# Patient Record
Sex: Female | Born: 1990 | Race: Black or African American | Hispanic: No | Marital: Single | Smoking: Current every day smoker
Health system: Southern US, Community
[De-identification: ages and names within clinical notes are randomized; demographics above are authoritative.]

## PROBLEM LIST (undated history)

## (undated) ENCOUNTER — Inpatient Hospital Stay (HOSPITAL_COMMUNITY): Payer: Self-pay

## (undated) DIAGNOSIS — Z789 Other specified health status: Secondary | ICD-10-CM

## (undated) HISTORY — PX: NO PAST SURGERIES: SHX2092

---
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
## 2009-01-03 ENCOUNTER — Emergency Department (HOSPITAL_COMMUNITY): Admission: EM | Admit: 2009-01-03 | Discharge: 2009-01-04 | Payer: Self-pay | Admitting: Emergency Medicine

## 2009-05-13 ENCOUNTER — Emergency Department (HOSPITAL_COMMUNITY): Admission: EM | Admit: 2009-05-13 | Discharge: 2009-05-13 | Payer: Self-pay | Admitting: Emergency Medicine

## 2009-05-24 ENCOUNTER — Emergency Department (HOSPITAL_COMMUNITY): Admission: EM | Admit: 2009-05-24 | Discharge: 2009-05-24 | Payer: Self-pay | Admitting: Emergency Medicine

## 2009-05-26 ENCOUNTER — Inpatient Hospital Stay (HOSPITAL_COMMUNITY): Admission: AD | Admit: 2009-05-26 | Discharge: 2009-05-26 | Payer: Self-pay | Admitting: Obstetrics & Gynecology

## 2009-10-03 ENCOUNTER — Inpatient Hospital Stay (HOSPITAL_COMMUNITY): Admission: AD | Admit: 2009-10-03 | Discharge: 2009-10-03 | Payer: Self-pay | Admitting: Obstetrics and Gynecology

## 2009-10-03 ENCOUNTER — Ambulatory Visit: Payer: Self-pay | Admitting: Physician Assistant

## 2009-11-15 ENCOUNTER — Inpatient Hospital Stay (HOSPITAL_COMMUNITY): Admission: AD | Admit: 2009-11-15 | Discharge: 2009-11-15 | Payer: Self-pay | Admitting: Obstetrics & Gynecology

## 2009-12-29 ENCOUNTER — Inpatient Hospital Stay (HOSPITAL_COMMUNITY): Admission: AD | Admit: 2009-12-29 | Discharge: 2009-12-29 | Payer: Self-pay | Admitting: Obstetrics & Gynecology

## 2009-12-29 ENCOUNTER — Ambulatory Visit: Payer: Self-pay | Admitting: Advanced Practice Midwife

## 2010-02-24 ENCOUNTER — Ambulatory Visit (HOSPITAL_COMMUNITY): Admission: RE | Admit: 2010-02-24 | Discharge: 2010-02-24 | Payer: Self-pay | Admitting: Family Medicine

## 2010-03-07 ENCOUNTER — Ambulatory Visit: Payer: Self-pay | Admitting: Family Medicine

## 2010-03-09 ENCOUNTER — Ambulatory Visit: Payer: Self-pay | Admitting: Family Medicine

## 2010-03-10 ENCOUNTER — Encounter: Payer: Self-pay | Admitting: Family Medicine

## 2010-03-10 LAB — CONVERTED CEMR LAB: GC Probe Amp, Genital: NEGATIVE

## 2010-03-13 ENCOUNTER — Ambulatory Visit: Payer: Self-pay | Admitting: Obstetrics & Gynecology

## 2010-03-13 LAB — CONVERTED CEMR LAB
Clue Cells Wet Prep HPF POC: NONE SEEN
Trich, Wet Prep: NONE SEEN

## 2010-03-16 ENCOUNTER — Encounter (INDEPENDENT_AMBULATORY_CARE_PROVIDER_SITE_OTHER): Payer: Self-pay | Admitting: Family Medicine

## 2010-03-16 ENCOUNTER — Encounter: Admission: RE | Admit: 2010-03-16 | Discharge: 2010-03-16 | Payer: Self-pay | Admitting: Obstetrics & Gynecology

## 2010-03-16 ENCOUNTER — Ambulatory Visit: Payer: Self-pay | Admitting: Obstetrics & Gynecology

## 2010-03-20 ENCOUNTER — Ambulatory Visit: Payer: Self-pay | Admitting: Obstetrics & Gynecology

## 2010-03-20 ENCOUNTER — Ambulatory Visit (HOSPITAL_COMMUNITY): Admission: RE | Admit: 2010-03-20 | Discharge: 2010-03-20 | Payer: Self-pay | Admitting: Family Medicine

## 2010-03-23 ENCOUNTER — Ambulatory Visit: Payer: Self-pay | Admitting: Obstetrics & Gynecology

## 2010-03-27 ENCOUNTER — Ambulatory Visit: Payer: Self-pay | Admitting: Obstetrics & Gynecology

## 2010-03-27 ENCOUNTER — Inpatient Hospital Stay (HOSPITAL_COMMUNITY): Admission: RE | Admit: 2010-03-27 | Discharge: 2010-03-30 | Payer: Self-pay | Admitting: Obstetrics & Gynecology

## 2010-09-07 LAB — POCT URINALYSIS DIPSTICK
Bilirubin Urine: NEGATIVE
Bilirubin Urine: NEGATIVE
Bilirubin Urine: NEGATIVE
Glucose, UA: NEGATIVE mg/dL
Glucose, UA: NEGATIVE mg/dL
Glucose, UA: NEGATIVE mg/dL
Hgb urine dipstick: NEGATIVE
Ketones, ur: NEGATIVE mg/dL
Ketones, ur: NEGATIVE mg/dL
Ketones, ur: NEGATIVE mg/dL
Nitrite: NEGATIVE
Specific Gravity, Urine: 1.02 (ref 1.005–1.030)
Specific Gravity, Urine: >=1.03 (ref 1.005–1.030)
pH: 7 (ref 5.0–8.0)

## 2010-09-07 LAB — CBC
HCT: 20.8 % — ABNORMAL LOW (ref 36.0–46.0)
Hemoglobin: 9.9 g/dL — ABNORMAL LOW (ref 12.0–15.0)
MCHC: 33.2 g/dL (ref 30.0–36.0)
MCHC: 34.3 g/dL (ref 30.0–36.0)
Platelets: 177 10*3/uL (ref 150–400)
Platelets: 228 10*3/uL (ref 150–400)
RDW: 14.8 % (ref 11.5–15.5)
WBC: 14.4 10*3/uL — ABNORMAL HIGH (ref 4.0–10.5)

## 2010-09-07 LAB — RPR: RPR Ser Ql: NONREACTIVE

## 2010-09-10 LAB — URINALYSIS, ROUTINE W REFLEX MICROSCOPIC
Glucose, UA: NEGATIVE mg/dL
Hgb urine dipstick: NEGATIVE
Protein, ur: NEGATIVE mg/dL
Specific Gravity, Urine: 1.015 (ref 1.005–1.030)
Urobilinogen, UA: 0.2 mg/dL (ref 0.0–1.0)

## 2010-09-10 LAB — URINE MICROSCOPIC-ADD ON

## 2010-09-11 LAB — GC/CHLAMYDIA PROBE AMP, GENITAL
Chlamydia, DNA Probe: NEGATIVE
GC Probe Amp, Genital: NEGATIVE

## 2010-09-11 LAB — WET PREP, GENITAL: Yeast Wet Prep HPF POC: NONE SEEN

## 2010-09-11 LAB — URINE CULTURE: Colony Count: >=100000

## 2010-09-11 LAB — URINALYSIS, ROUTINE W REFLEX MICROSCOPIC
Bilirubin Urine: NEGATIVE
Glucose, UA: NEGATIVE mg/dL
Ketones, ur: NEGATIVE mg/dL
Protein, ur: NEGATIVE mg/dL
pH: 6.5 (ref 5.0–8.0)

## 2010-09-11 LAB — URINE MICROSCOPIC-ADD ON

## 2010-09-13 LAB — URINALYSIS, ROUTINE W REFLEX MICROSCOPIC
Glucose, UA: NEGATIVE mg/dL
Protein, ur: NEGATIVE mg/dL
Specific Gravity, Urine: >1.03 — ABNORMAL HIGH (ref 1.005–1.030)
pH: 6 (ref 5.0–8.0)

## 2010-09-13 LAB — GC/CHLAMYDIA PROBE AMP, GENITAL
Chlamydia, DNA Probe: NEGATIVE
GC Probe Amp, Genital: NEGATIVE

## 2010-09-13 LAB — POCT PREGNANCY, URINE: Preg Test, Ur: POSITIVE

## 2010-09-26 LAB — CBC
HCT: 37.6 % (ref 36.0–46.0)
MCHC: 32.6 g/dL (ref 30.0–36.0)
MCV: 85.9 fL (ref 78.0–100.0)
Platelets: 256 10*3/uL (ref 150–400)
WBC: 8.8 10*3/uL (ref 4.0–10.5)

## 2010-09-26 LAB — URINALYSIS, ROUTINE W REFLEX MICROSCOPIC
Bilirubin Urine: NEGATIVE
Ketones, ur: NEGATIVE mg/dL
Nitrite: NEGATIVE
pH: 7 (ref 5.0–8.0)

## 2010-09-26 LAB — GC/CHLAMYDIA PROBE AMP, GENITAL
Chlamydia, DNA Probe: NEGATIVE
GC Probe Amp, Genital: NEGATIVE

## 2010-09-26 LAB — URINE MICROSCOPIC-ADD ON

## 2010-09-26 LAB — POCT PREGNANCY, URINE: Preg Test, Ur: NEGATIVE

## 2010-09-26 LAB — WET PREP, GENITAL: Yeast Wet Prep HPF POC: NONE SEEN

## 2010-09-27 LAB — URINALYSIS, ROUTINE W REFLEX MICROSCOPIC
Ketones, ur: NEGATIVE mg/dL
Nitrite: NEGATIVE
Protein, ur: NEGATIVE mg/dL
pH: 6 (ref 5.0–8.0)

## 2010-09-27 LAB — GC/CHLAMYDIA PROBE AMP, GENITAL: Chlamydia, DNA Probe: NEGATIVE

## 2010-10-01 LAB — URINALYSIS, ROUTINE W REFLEX MICROSCOPIC
Nitrite: NEGATIVE
Specific Gravity, Urine: 1.018 (ref 1.005–1.030)
pH: 7.5 (ref 5.0–8.0)

## 2010-10-01 LAB — URINE MICROSCOPIC-ADD ON

## 2011-03-21 LAB — URINALYSIS, ROUTINE W REFLEX MICROSCOPIC
Bilirubin Urine: NEGATIVE
Glucose, UA: NEGATIVE
Ketones, ur: NEGATIVE
pH: 6

## 2011-03-21 LAB — CBC
Hemoglobin: 11.5 — ABNORMAL LOW
MCHC: 33.9
RBC: 3.94
WBC: 7.9

## 2011-03-21 LAB — WET PREP, GENITAL: Yeast Wet Prep HPF POC: NONE SEEN

## 2011-03-21 LAB — GC/CHLAMYDIA PROBE AMP, GENITAL
Chlamydia, DNA Probe: NEGATIVE
GC Probe Amp, Genital: NEGATIVE

## 2011-03-21 LAB — PROTIME-INR: INR: 0.9

## 2011-03-22 LAB — CULTURE, ROUTINE-ABSCESS

## 2011-03-23 LAB — POCT URINALYSIS DIP (DEVICE)
Bilirubin Urine: NEGATIVE
Glucose, UA: NEGATIVE
Hgb urine dipstick: NEGATIVE
Nitrite: NEGATIVE
Operator id: 148111
Urobilinogen, UA: 0.2

## 2011-03-26 LAB — POCT URINALYSIS DIP (DEVICE)
Bilirubin Urine: NEGATIVE
Bilirubin Urine: NEGATIVE
Bilirubin Urine: NEGATIVE
Glucose, UA: NEGATIVE
Glucose, UA: NEGATIVE
Glucose, UA: NEGATIVE
Glucose, UA: NEGATIVE
Hgb urine dipstick: NEGATIVE
Hgb urine dipstick: NEGATIVE
Nitrite: NEGATIVE
Nitrite: NEGATIVE
Nitrite: NEGATIVE
Nitrite: NEGATIVE
Nitrite: POSITIVE — AB
Operator id: 194561
Operator id: 200901
Protein, ur: 30 — AB
Urobilinogen, UA: 0.2
Urobilinogen, UA: 1
Urobilinogen, UA: 1
Urobilinogen, UA: 1
Urobilinogen, UA: 1
pH: 7
pH: 7
pH: 7

## 2011-03-27 LAB — POCT URINALYSIS DIP (DEVICE)
Nitrite: POSITIVE — AB
Protein, ur: 30 — AB
pH: 7

## 2011-03-27 LAB — CBC
MCHC: 32.4
Platelets: 260
RDW: 14.3

## 2011-03-28 LAB — POCT URINALYSIS DIP (DEVICE)
Glucose, UA: NEGATIVE
Hgb urine dipstick: NEGATIVE
Nitrite: NEGATIVE
Operator id: 120861
Protein, ur: NEGATIVE
Urobilinogen, UA: 1

## 2011-03-30 LAB — URINE CULTURE: Colony Count: >=100000

## 2011-03-30 LAB — URINE MICROSCOPIC-ADD ON

## 2011-03-30 LAB — URINALYSIS, ROUTINE W REFLEX MICROSCOPIC
Bilirubin Urine: NEGATIVE
Glucose, UA: NEGATIVE mg/dL
Hgb urine dipstick: NEGATIVE
Ketones, ur: NEGATIVE mg/dL
Nitrite: NEGATIVE
Protein, ur: NEGATIVE mg/dL
Specific Gravity, Urine: 1.028 (ref 1.005–1.030)
Urobilinogen, UA: 1 mg/dL (ref 0.0–1.0)
pH: 6.5 (ref 5.0–8.0)

## 2011-05-23 ENCOUNTER — Emergency Department (HOSPITAL_COMMUNITY)
Admission: EM | Admit: 2011-05-23 | Discharge: 2011-05-24 | Disposition: A | Discharge status: Home / Self Care | Payer: Self-pay | Attending: Emergency Medicine | Admitting: Emergency Medicine

## 2011-05-23 ENCOUNTER — Encounter: Payer: Self-pay | Admitting: *Deleted

## 2011-05-23 DIAGNOSIS — N644 Mastodynia: Secondary | ICD-10-CM | POA: Insufficient documentation

## 2011-05-23 DIAGNOSIS — N6009 Solitary cyst of unspecified breast: Secondary | ICD-10-CM | POA: Insufficient documentation

## 2011-05-23 NOTE — ED Notes (Signed)
Pt reports having a left breat lump that developed "sometime ago" Pt cannot state when pain started. Pt states that [facility] in [place] but is unable to state when she went to the center. Pt reports that she was supposed to follow up with them but never did. Pt is a very poor historian. 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 Fiancee present. Pt has a small hard, movable mass near the areola of left breat that is moveable. Pt did not flinch when mass was touched.

## 2011-05-24 MED ORDER — IBUPROFEN 800 MG PO TABS: 800.0000 mg | ORAL_TABLET | Freq: Three times a day (TID) | ORAL | Status: AC

## 2011-05-24 NOTE — ED Provider Notes (Signed)
History     CSN: [id number] Arrival date & time: 05/23/2011  9:43 PM   First MD Initiated Contact with Patient 05/24/11 0258      Chief Complaint  Patient presents with  . Breast Pain   (Consider location/radiation/quality/duration/timing/severity/associated sxs/prior treatment) The history is provided by the patient.  Left breast pain that is intermittent. The patient states "I don't know how long it has been a problem". She cannot give a clear history of recurrence, whether daily or weekly. She has noticed a lump. The pain is always located in the same place and does not affect other areas. No nipple discharge, redness or swelling. She reports a family history of breast cancers.    History reviewed. No pertinent past medical history.  History reviewed. No pertinent past surgical history.  Family History  Problem Relation Age of Onset  . Asthma Mother     History  Substance Use Topics  . Smoking status: Former Smoker -- 1.0 packs/day for 2 years    Types: Cigarettes    Quit date: 05/22/2008  . Smokeless tobacco: Never Used  . Alcohol Use: No    OB History    Grav Para Term Preterm Abortions TAB SAB Ect Mult Living   2 2 2              Review of Systems  Constitutional: Negative for fever and chills.  HENT: Negative.   Respiratory: Negative.   Cardiovascular: Negative.   Gastrointestinal: Negative.   Genitourinary:       C/o left breast pain, intermittent.  Musculoskeletal: Negative.   Skin: Negative.   Neurological: Negative.     Allergies  Review of patient's allergies indicates no known allergies.  Home Medications  No current outpatient prescriptions on file.  BP 114/70  Pulse 69  Temp(Src) 98.9 F (37.2 C) (Oral)  Resp 16  SpO2 100%  Physical Exam  Constitutional: She is oriented to person, place, and time. She appears well-developed and well-nourished.  Neck: Normal range of motion.  Pulmonary/Chest: Effort normal.       Left breast  without swelling. There is a palpable, tender, firm cyst in the upper outer quadrant, adjacent to the aereola. No nipple discharge. No axillary lymph nodes palpable.   Neurological: She is alert and oriented to person, place, and time.  Skin: Skin is warm and dry.    ED Course  Procedures (including critical care time)  Labs Reviewed - No data to display No results found.   No diagnosis found.    MDM          Rodena Medin, PA 05/24/11 [phone]

## 2011-05-24 NOTE — ED Provider Notes (Signed)
Medical screening examination/treatment/procedure(s) were performed by non-physician practitioner and as supervising physician I was immediately available for consultation/collaboration.  Olivia Mackie, MD 05/24/11 [phone]

## 2011-05-24 NOTE — ED Notes (Signed)
Pt alert, nad, c/o ? "lump" to inner aspect of left breast, onset unknown, pt states tender to touch, denies recent illness or exposures

## 2011-06-27 ENCOUNTER — Encounter (INDEPENDENT_AMBULATORY_CARE_PROVIDER_SITE_OTHER): Payer: Self-pay | Admitting: General Surgery

## 2011-07-02 ENCOUNTER — Encounter (INDEPENDENT_AMBULATORY_CARE_PROVIDER_SITE_OTHER): Payer: Self-pay | Admitting: General Surgery

## 2011-07-02 ENCOUNTER — Ambulatory Visit (INDEPENDENT_AMBULATORY_CARE_PROVIDER_SITE_OTHER): Payer: Self-pay | Admitting: General Surgery

## 2011-07-02 VITALS — BP 114/58 | HR 75 | Temp 98.2°F | Ht 65.5 in | Wt 136.8 lb

## 2011-07-02 DIAGNOSIS — D249 Benign neoplasm of unspecified breast: Secondary | ICD-10-CM | POA: Insufficient documentation

## 2011-07-02 NOTE — Patient Instructions (Signed)
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 You will be scheduled for an operation to remove this lump in the near future.  Breast Biopsy WHY YOU NEED A BIOPSY Your caregiver has recommended that you have a breast tissue sample taken (biopsy). This is done to be certain that the lump or abnormality found in your breast is not cancerous (malignant). During a biopsy, a small piece of tissue is removed, so it can be examined under a microscope by a specialist (pathologist) who looks at tissues and cells and diagnoses abnormalities in them. Most lumps (tumors) or abnormalities, on or in the breast, are not cancerous (benign). However, biopsies are taken when your caregiver cannot be absolutely certain of what is wrong only from doing a physical exam, mammogram (breast X-ray), or other studies. A breast biopsy can tell you whether nothing more needs to be done, or you need more surgery or another type of treatment. A biopsy is done when there is:  Any undiagnosed breast mass.   Nipple abnormalities, dimpling, crusting, or ulcerations.   Calcium deposits (calcifications) or abnormalities seen on your mammogram, ultrasound, or MRI.   Suspicious changes in the breast (thickening, asymmetry) seen on mammogram.   Abnormal discharge from the nipple, especially blood.   Redness, swelling, and pain of the breast.  HOW A BIOPSY IS PERFORMED A biopsy is often performed on an outpatient basis (you go home the same day). This can be done in a hospital, clinic, or surgical center. Tissue samples (biopsies) are often done under local anesthesia (area is numbed). Sometimes general anesthetics are required, in which case you sleep through the procedure. Biopsies may remove the entire lump, a small piece of the lump, or a small sliver of tissue removed by needle. TYPES OF BREAST BIOPSY  Fine needle aspiration. A thin needle is placed through the skin, to the lump or cyst,  and cells are removed.   Core needle biopsy. A large needle with a special tip is placed through the skin, to the abnormality, and a piece of tissue is removed.   Stereotactic biopsy. A core needle with a special X-ray is used, to direct the needle to the lump or abnormal area, which is difficult to feel or cannot be felt.   Vacuum-assisted biopsy. A hollow probe and a gentle vacuum remove a sample of tissue.   Ultrasound guided core needle biopsy. You lie on your stomach, with your breast through an opening, and a high frequency ultrasound helps guide the needle to the area of the abnormality.   Open biopsy. An incision is made in the breast, and a piece of the lump or the whole lump is removed.  LET YOUR CAREGIVER KNOW ABOUT:  Allergies.   Medicines taken, including herbs, eye drops, over-the-counter medicines, and creams.   Use of steroids (by mouth or creams).   Previous problems with anesthetics or Novocaine.   If you are taking aspirin or blood thinners.   Possibility of pregnancy, if this applies.   History of blood clots (thrombophlebitis).   History of bleeding or blood problems.   Previous surgery.   Other health problems.  RISKS AND COMPLICATIONS   Bleeding.   Infection.   Allergy to medicines.   Bruising and swelling of the breast.   Alteration in the shape of the breast.   Not finding the lump or abnormality.   Needing more surgery.  BEFORE THE PROCEDURE  You should arrive 96  minutes prior to your procedure or as directed.   Check-in at the admissions desk, to fill out necessary forms, if you are not preregistered.   There will be consent forms to sign, prior to the procedure.   There is a waiting area for your family, while you are having your biopsy.   Try to have someone with you, to drive you home.   Do not smoke for 2 weeks before the surgery.   Let your caregiver know if you develop a cold or an infection.   Do not drink alcohol for at  least 24 hours before surgery.   Wear a good support bra to the surgery.  AFTER THE PROCEDURE  After surgery, you will be taken to the recovery area, where a nurse will watch and check your progress. Once you are awake, stable, and taking fluids well, if there are no other problems, you will be allowed to go home.   Ice packs applied to your operative site may help with discomfort and keep the swelling down.   You may resume normal diet and activities as directed. Avoid strenuous activities affecting the arm on the side of the biopsy, such as tennis, swimming, heavy lifting (more than 10 pounds) or pulling.   Bruising in the breast is normal following this procedure.   Wearing a support bra, even to bed, may be more comfortable. The bra will also help keep the dressing on.   Change dressings as directed.   Your doctor may apply a pressure dressing on your breast for 24 to 48 hours.   Only take over-the-counter or prescription medicines for pain, discomfort, or fever as directed by your caregiver.   Do not take aspirin, because it can cause bleeding.  HOME CARE INSTRUCTIONS   You may resume your usual diet.   Have someone drive you home after the surgery.   Do not do any exercise, driving, lifting or general activities without your caregiver's permission.   Take medicines and over-the-counter medicines, as ordered by your caregiver.   Keep your postoperative appointments as recommended.   Do not drink alcohol while taking pain medicine.  Finding out the results of your test Not all test results are available during your visit. If your test results are not back during the visit, make an appointment with your caregiver to find out the results. Do not assume everything is normal if you have not heard from your caregiver or the medical facility. It is important for you to follow up on all of your test results.  SEEK MEDICAL CARE IF:   You notice redness, swelling, or increasing pain  in the wound.   You notice a bad smell coming from the wound or dressing.   You develop a rash.   You need stronger pain medicine.   You are having an allergic reaction or problems with your medicines.  SEEK IMMEDIATE MEDICAL CARE IF:   You have difficulty breathing.   You have a fever.   There is increased bleeding (more than a small spot) from the wound.   Pus is coming from the wound.   The wound is breaking open.  Document Released: 06/11/2005 Document Revised: 02/21/2011 Document Reviewed: 04/29/2009 [facility] Patient Information 2012 [place], [place].

## 2011-07-02 NOTE — Progress Notes (Signed)
Patient ID: Melinda Barnes, female   DOB: [birth year], 21 y.o.   MRN: [id number]  Chief Complaint  Patient presents with  . 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 female.  She is referred by Dr. 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  The patient is vague about the history of this lump, but states that it has been there for at least a year. She states that it is painful and it has enlarged. Recent ultrasound was performed at [facility] and this reveals a 1.2 x 1.8 cm well-circumscribed mass at the 1:00 position, 2 cm from the nipple. 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 The patient requested surgical excision.  Family history is positive for breast cancer in her maternal grandmother. There is no other family history of breast cancer or ovarian cancer. The patient has had 2 pregnancies and 2 deliveries.  There is a woman here with her in the office that she introduced as her fianc. HPI  History reviewed. No pertinent past medical history.  History reviewed. No pertinent past surgical history.  Family History  Problem Relation Age of Onset  . Asthma Mother     Social History History  Substance Use Topics  . Smoking status: Former Smoker -- 1.0 packs/day for 2 years    Types: Cigarettes    Quit date: 05/22/2008  . Smokeless tobacco: Never Used  . Alcohol Use: No    No Known Allergies  No current outpatient prescriptions on file.    Review of Systems Review of Systems  Constitutional: Negative for fever, chills and unexpected weight change.  HENT: Negative for hearing loss, congestion, sore throat, trouble swallowing and voice change.   Eyes: Negative for visual disturbance.  Respiratory: Negative for cough and wheezing.   Cardiovascular: Negative for chest pain, palpitations and leg swelling.  Gastrointestinal: Negative  for nausea, vomiting, abdominal pain, diarrhea, constipation, blood in stool, abdominal distention and anal bleeding.  Genitourinary: Negative for hematuria, vaginal bleeding and difficulty urinating.  Musculoskeletal: Negative for arthralgias.  Skin: Negative for rash and wound.  Neurological: Negative for seizures, syncope and headaches.  Hematological: Negative for adenopathy. Does not bruise/bleed easily.  Psychiatric/Behavioral: Negative for confusion.    Blood pressure 114/58, pulse 75, temperature 98.2 F (36.8 C), temperature source Temporal, height 5' 5.5" (1.664 m), weight 136 lb 12.8 oz (62.052 kg), SpO2 99.00%.  Physical Exam Physical Exam  Constitutional: She is oriented to person, place, and time. She appears well-developed and well-nourished. No distress.  HENT:  Head: Normocephalic and atraumatic.  Nose: Nose normal.  Mouth/Throat: No oropharyngeal exudate.  Eyes: Conjunctivae and EOM are normal. Pupils are equal, round, and reactive to light. Left eye exhibits no discharge. No scleral icterus.  Neck: Neck supple. No JVD present. No tracheal deviation present. No thyromegaly present.  Cardiovascular: Normal rate, regular rhythm, normal heart sounds and intact distal pulses.   No murmur heard. Pulmonary/Chest: Effort normal and breath sounds normal. No respiratory distress. She has no wheezes. She has no rales. She exhibits no tenderness.    Abdominal: Soft. Bowel sounds are normal. She exhibits no distension and no mass. There is no tenderness. There is no rebound and no guarding.       Stretch marks noted.  Musculoskeletal: She exhibits no edema and no tenderness.  Lymphadenopathy:    She has  no cervical adenopathy.  Neurological: She is alert and oriented to person, place, and time. She exhibits normal muscle tone. Coordination normal.  Skin: Skin is warm. No rash noted. She is not diaphoretic. No erythema. No pallor.  Psychiatric: She has a normal mood and affect.  Her behavior is normal. Judgment and thought content normal.    Data Reviewed  I reviewed her ultrasound and ultrasound report. Assessment    Left breast mass at 2:00 position. Enlarging and painful by history. 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  Multiparity    Plan    The tentative diagnosis and natural history of fibroadenoma was discussed with the patient. She is aware that there is a very low risk of cancer, but that excision of this is indicated due to enlargement. She very strongly wants to have this removed.  We will schedule for excisional biopsy of her left breast mass under general anesthesia in the near future.  I discussed the indications and details of surgery with her. Risks and complications have been outlined, including but not limited to bleeding, infection, skin necrosis, further surgery if this is cancer, cosmetic deformity, nerve damage with chronic pain, and other unforeseen complications. She understands all of these issues. All questions are answered. She agrees with the plan.       Tryton Bodi M 07/02/2011, 2:33 PM

## 2011-07-04 ENCOUNTER — Encounter (INDEPENDENT_AMBULATORY_CARE_PROVIDER_SITE_OTHER): Payer: Self-pay | Admitting: Obstetrics & Gynecology

## 2011-09-29 ENCOUNTER — Encounter (HOSPITAL_COMMUNITY): Payer: Self-pay | Admitting: Physical Medicine and Rehabilitation

## 2011-09-29 ENCOUNTER — Emergency Department (HOSPITAL_COMMUNITY)
Admission: EM | Admit: 2011-09-29 | Discharge: 2011-09-29 | Disposition: A | Discharge status: Home Health | Payer: Medicaid Other | Attending: Emergency Medicine | Admitting: Emergency Medicine

## 2011-09-29 DIAGNOSIS — F17200 Nicotine dependence, unspecified, uncomplicated: Secondary | ICD-10-CM | POA: Insufficient documentation

## 2011-09-29 DIAGNOSIS — J069 Acute upper respiratory infection, unspecified: Secondary | ICD-10-CM | POA: Insufficient documentation

## 2011-09-29 DIAGNOSIS — J029 Acute pharyngitis, unspecified: Secondary | ICD-10-CM

## 2011-09-29 NOTE — Discharge Instructions (Signed)
YOUR STREP TEST IS NEGATIVE, SUPPORTING A VIRAL SOURCE OF YOUR SYMPTOMS. YOU CAN BE DISCHARGED HOME. RECOMMEND WARM, SALT WATER GARGLES TO RELIEVE SORE THROAT PAIN, OVER-THE-COUNTER COLD REMEDIES - TYLENOL COLD AND SINUS, MUCINEX, ETC. PUSH FLUIDS.   Antibiotic Nonuse  Your caregiver felt that the infection or problem was not one that would be helped with an antibiotic. Infections may be caused by viruses or bacteria. Only a caregiver can tell which one of these is the likely cause of an illness. A cold is the most common cause of infection in both adults and children. A cold is a virus. Antibiotic treatment will have no effect on a viral infection. Viruses can lead to many lost days of work caring for sick children and many missed days of school. Children may catch as many as 10 "colds" or "flus" per year during which they can be tearful, cranky, and uncomfortable. The goal of treating a virus is aimed at keeping the ill person comfortable. Antibiotics are medications used to help the body fight bacterial infections. There are relatively few types of bacteria that cause infections but there are hundreds of viruses. While both viruses and bacteria cause infection they are very different types of germs. A viral infection will typically go away by itself within 7 to 10 days. Bacterial infections may spread or get worse without antibiotic treatment. Examples of bacterial infections are:  Sore throats (like strep throat or tonsillitis).   Infection in the lung (pneumonia).   Ear and skin infections.  Examples of viral infections are:  Colds or flus.   Most coughs and bronchitis.   Sore throats not caused by Strep.   Runny noses.  It is often best not to take an antibiotic when a viral infection is the cause of the problem. Antibiotics can kill off the helpful bacteria that we have inside our body and allow harmful bacteria to start growing. Antibiotics can cause side effects such as allergies,  nausea, and diarrhea without helping to improve the symptoms of the viral infection. Additionally, repeated uses of antibiotics can cause bacteria inside of our body to become resistant. That resistance can be passed onto harmful bacterial. The next time you have an infection it may be harder to treat if antibiotics are used when they are not needed. Not treating with antibiotics allows our own immune system to develop and take care of infections more efficiently. Also, antibiotics will work better for [place] when they are prescribed for bacterial infections. Treatments for a child that is ill may include:  Give extra fluids throughout the day to stay hydrated.   Get plenty of rest.   Only give your child over-the-counter or prescription medicines for pain, discomfort, or fever as directed by your caregiver.   The use of a cool mist humidifier may help stuffy noses.   Cold medications if suggested by your caregiver.  Your caregiver may decide to start you on an antibiotic if:  The problem you were seen for today continues for a longer length of time than expected.   You develop a secondary bacterial infection.  SEEK MEDICAL CARE IF:  Fever lasts longer than 5 days.   Symptoms continue to get worse after 5 to 7 days or become severe.   Difficulty in breathing develops.   Signs of dehydration develop (poor drinking, rare urinating, dark colored urine).   Changes in behavior or worsening tiredness (listlessness or lethargy).  Document Released: 08/20/2001 Document Revised: 05/31/2011 Document Reviewed: 02/16/2009 [employer] Patient  Information 2012 [place], [place].  Upper Respiratory Infection, Adult An upper respiratory infection (URI) is also known as the common cold. It is often caused by a type of germ (virus). Colds are easily spread (contagious). You can pass it to others by kissing, coughing, sneezing, or drinking out of the same glass. Usually, you get better in 1 or 2 weeks.  HOME  CARE   Only take medicine as told by your doctor.   Use a warm mist humidifier or breathe in steam from a hot shower.   Drink enough water and fluids to keep your pee (urine) clear or pale yellow.   Get plenty of rest.   Return to work when your temperature is back to normal or as told by your doctor. You may use a face mask and wash your hands to stop your cold from spreading.  GET HELP RIGHT AWAY IF:   After the first few days, you feel you are getting worse.   You have questions about your medicine.   You have chills, shortness of breath, or brown or red spit (mucus).   You have yellow or brown snot (nasal discharge) or pain in the face, especially when you bend forward.   You have a fever, puffy (swollen) neck, pain when you swallow, or white spots in the back of your throat.   You have a bad headache, ear pain, sinus pain, or chest pain.   You have a high-pitched whistling sound when you breathe in and out (wheezing).   You have a lasting cough or cough up blood.   You have sore muscles or a stiff neck.  MAKE SURE YOU:   Understand these instructions.   Will watch your condition.   Will get help right away if you are not doing well or get worse.  Document Released: 11/28/2007 Document Revised: 05/31/2011 Document Reviewed: 10/16/2010 [facility] Patient Information 2012 [place], [place].

## 2011-09-29 NOTE — ED Provider Notes (Signed)
History     CSN: [id number]  Arrival date & time 09/29/11  1543   First MD Initiated Contact with Patient 09/29/11 1619      Chief Complaint  Patient presents with  . Sore Throat    (Consider location/radiation/quality/duration/timing/severity/associated sxs/prior treatment) Patient is a 21 y.o. female presenting with pharyngitis. The history is provided by the patient.  Sore Throat This is a new problem. The current episode started in the past 7 days. The problem occurs constantly. Associated symptoms include congestion, coughing and a sore throat. Pertinent negatives include no chills, fever, nausea or rash. The symptoms are aggravated by swallowing.    No past medical history on file.  No past surgical history on file.  Family History  Problem Relation Age of Onset  . Asthma Mother     History  Substance Use Topics  . Smoking status: Former Smoker -- 1.0 packs/day for 2 years    Types: Cigarettes    Quit date: 05/22/2008  . Smokeless tobacco: Never Used  . Alcohol Use: No    OB History    Grav Para Term Preterm Abortions TAB SAB Ect Mult Living   2 2 2              Review of Systems  Constitutional: Negative for fever and chills.  HENT: Positive for congestion and sore throat.   Respiratory: Positive for cough.   Cardiovascular: Negative.   Gastrointestinal: Negative.  Negative for nausea.  Musculoskeletal: Negative.   Skin: Negative.  Negative for rash.  Neurological: Negative.     Allergies  Review of patient's allergies indicates no known allergies.  Home Medications  No current outpatient prescriptions on file.  BP 113/65  Pulse 78  Temp(Src) 99.1 F (37.3 C) (Oral)  Resp 20  SpO2 98%  Physical Exam  Constitutional: She appears well-developed and well-nourished.  HENT:  Head: Normocephalic.  Right Ear: No middle ear effusion.  Left Ear:  No middle ear effusion.  Nose: Mucosal edema present.  Mouth/Throat: Mucous membranes are normal.  Mucous membranes are not dry. No uvula swelling. Posterior oropharyngeal erythema present. No posterior oropharyngeal edema or tonsillar abscesses.  Neck: Normal range of motion. Neck supple.  Cardiovascular: Normal rate and regular rhythm.   Pulmonary/Chest: Effort normal and breath sounds normal.  Abdominal: Soft. Bowel sounds are normal. There is no tenderness. There is no rebound and no guarding.  Musculoskeletal: Normal range of motion.  Neurological: She is alert. No cranial nerve deficit.  Skin: Skin is warm and dry. No rash noted.  Psychiatric: She has a normal mood and affect.    ED Course  Procedures (including critical care time)   Labs Reviewed  RAPID STREP SCREEN   No results found.   No diagnosis found. 1. uri   MDM          Rodena Medin, PA-C 09/29/11 1725

## 2011-09-29 NOTE — ED Notes (Signed)
Pt presents to department for evaluation of runny nose, cough and sore throat. Onset yesterday while at home. No relief from OTC medications. Denies fever.

## 2011-09-29 NOTE — ED Provider Notes (Signed)
Medical screening examination/treatment/procedure(s) were performed by non-physician practitioner and as supervising physician I was immediately available for consultation/collaboration.   Nat Christen, MD 09/29/11 2001

## 2012-01-30 ENCOUNTER — Emergency Department (HOSPITAL_COMMUNITY)
Admission: EM | Admit: 2012-01-30 | Discharge: 2012-01-30 | Disposition: A | Discharge status: Home / Self Care | Payer: Medicaid Other | Attending: Emergency Medicine | Admitting: Emergency Medicine

## 2012-01-30 ENCOUNTER — Encounter (HOSPITAL_COMMUNITY): Payer: Self-pay | Admitting: *Deleted

## 2012-01-30 DIAGNOSIS — N6320 Unspecified lump in the left breast, unspecified quadrant: Secondary | ICD-10-CM

## 2012-01-30 DIAGNOSIS — IMO0002 Reserved for concepts with insufficient information to code with codable children: Secondary | ICD-10-CM | POA: Insufficient documentation

## 2012-01-30 DIAGNOSIS — L02412 Cutaneous abscess of left axilla: Secondary | ICD-10-CM

## 2012-01-30 DIAGNOSIS — D249 Benign neoplasm of unspecified breast: Secondary | ICD-10-CM

## 2012-01-30 DIAGNOSIS — N63 Unspecified lump in unspecified breast: Secondary | ICD-10-CM | POA: Insufficient documentation

## 2012-01-30 MED ORDER — IBUPROFEN 600 MG PO TABS: 600.0000 mg | ORAL_TABLET | Freq: Four times a day (QID) | ORAL | Status: AC | PRN

## 2012-01-30 MED ORDER — OXYCODONE-ACETAMINOPHEN 5-325 MG PO TABS
1.0000 | ORAL_TABLET | Freq: Once | ORAL | Status: AC
  Administered 2012-01-30: 1 via ORAL
  Filled 2012-01-30: qty 1

## 2012-01-30 NOTE — ED Notes (Signed)
Pt reports mass, bump in left breast. 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 Abscess under left arm.

## 2012-01-30 NOTE — ED Provider Notes (Signed)
History     CSN: [id number]  Arrival date & time 01/30/12  1346   First MD Initiated Contact with Patient 01/30/12 1445      Chief Complaint  Patient presents with  . Breast Mass  . Abscess    (Consider location/radiation/quality/duration/timing/severity/associated sxs/prior treatment) HPI Comments: 21 y/o female presents with lump under her left armpit x 3 days. States it has gotten bigger in size and is painful. Admits to shaving in that area. Denies any drainage. Denies fever or chills. Also has lump in left breast which she has had for "many years". She was diagnosed in the past with fibroadenoma. Mass is not painful and has never changed in size. Her mom told her that while she was in the emergency room today she should have the mass looked at again while she was getting the abscess taken care of. Unsure who diagnosed her in the past, but she was told it was benign unless it changed. Denies any correlation with her menses. No nipple discharge.  The history is provided by the patient.    History reviewed. No pertinent past medical history.  History reviewed. No pertinent past surgical history.  Family History  Problem Relation Age of Onset  . Asthma Mother     History  Substance Use Topics  . Smoking status: Former Smoker -- 1.0 packs/day for 2 years    Types: Cigarettes    Quit date: 05/22/2008  . Smokeless tobacco: Never Used  . Alcohol Use: No    OB History    Grav Para Term Preterm Abortions TAB SAB Ect Mult Living   2 2 2              Review of Systems  Constitutional: Negative for fever and chills.  Genitourinary:       Positive for breast mass  Skin: Negative for color change and rash.       Lump in left armpit    Allergies  Review of patient's allergies indicates no known allergies.  Home Medications  No current outpatient prescriptions on file.  BP 105/59  Pulse 78  Temp 98.2 F (36.8 C) (Oral)  Resp 16  SpO2 99%  LMP 01/02/2012  Physical  Exam  Nursing note and vitals reviewed. Constitutional: She is oriented to person, place, and time. She appears well-developed and well-nourished. No distress.  HENT:  Head: Normocephalic and atraumatic.  Mouth/Throat: Oropharynx is clear and moist.  Eyes: Conjunctivae are normal.  Neck: Neck supple.  Cardiovascular: Normal rate, regular rhythm and normal heart sounds.   Pulmonary/Chest: Effort normal and breath sounds normal.  Genitourinary: No breast tenderness or discharge.       1cm round soft mobile nontender mass present in left breast lateral to areola. No nipple discharge or retraction. No edema or erythema.   Neurological: She is alert and oriented to person, place, and time.  Skin: Skin is warm and dry. No rash noted.       1.5 cm diameter flucctuant abscess present in left axillary region. Tender to palpation. No surrounding erythema. No active discharge.   Psychiatric: She has a normal mood and affect. Her behavior is normal.    ED Course  Procedures (including critical care time) INCISION AND DRAINAGE Performed by: Johnnette Gourd Consent: Verbal consent obtained. Risks and benefits: risks, benefits and alternatives were discussed Type: abscess  Body area: left axilla  Anesthesia: local infiltration  Local anesthetic: lidocaine 2% without epinephrine  Anesthetic total: 5 ml  Complexity: complex Blunt  dissection to break up loculations  Drainage: purulent  Drainage amount: large  Packing material: 1/4 in iodoform gauze  Patient tolerance: Patient tolerated the procedure well with no immediate complications.    Labs Reviewed - No data to display No results found.   1. Abscess of axilla, left   2. Breast mass, left   3. Fibroadenoma of breast       MDM  21 y/o female with abscess in left axilla. I&D performed without problem. No evidence of cellulitis or infection. Discussed fibroadenoma with patient and will give referral for gynecology. Round,  soft, mobile non tender mass consistent with fibroadenoma.         Trevor Mace, PA-C 01/30/12 1601

## 2012-01-30 NOTE — ED Notes (Signed)
Pt states she's had a lump in her L breasts for years, then 3 days ago developed a lump in her L under arm, pt states it is painful and burns when arm is down or put anything on it. Appears to be a small abscess, reddened.

## 2012-01-30 NOTE — ED Notes (Signed)
PA at bedside.

## 2012-01-31 NOTE — ED Provider Notes (Signed)
Medical screening examination/treatment/procedure(s) were performed by non-physician practitioner and as supervising physician I was immediately available for consultation/collaboration.  Toy Baker, MD 01/31/12 [phone]

## 2013-03-05 ENCOUNTER — Encounter (HOSPITAL_COMMUNITY): Payer: Self-pay | Admitting: *Deleted

## 2013-03-05 ENCOUNTER — Emergency Department (HOSPITAL_COMMUNITY)
Admission: EM | Admit: 2013-03-05 | Discharge: 2013-03-05 | Disposition: A | Discharge status: Home / Self Care | Payer: Medicaid Other | Attending: Emergency Medicine | Admitting: Emergency Medicine

## 2013-03-05 DIAGNOSIS — R350 Frequency of micturition: Secondary | ICD-10-CM | POA: Insufficient documentation

## 2013-03-05 DIAGNOSIS — N760 Acute vaginitis: Secondary | ICD-10-CM | POA: Insufficient documentation

## 2013-03-05 DIAGNOSIS — Z87891 Personal history of nicotine dependence: Secondary | ICD-10-CM | POA: Insufficient documentation

## 2013-03-05 DIAGNOSIS — Z3202 Encounter for pregnancy test, result negative: Secondary | ICD-10-CM | POA: Insufficient documentation

## 2013-03-05 DIAGNOSIS — B9689 Other specified bacterial agents as the cause of diseases classified elsewhere: Secondary | ICD-10-CM | POA: Insufficient documentation

## 2013-03-05 DIAGNOSIS — A499 Bacterial infection, unspecified: Secondary | ICD-10-CM | POA: Insufficient documentation

## 2013-03-05 LAB — URINE MICROSCOPIC-ADD ON

## 2013-03-05 LAB — URINALYSIS, ROUTINE W REFLEX MICROSCOPIC
Bilirubin Urine: NEGATIVE
Hgb urine dipstick: NEGATIVE
Ketones, ur: NEGATIVE mg/dL
Specific Gravity, Urine: 1.028 (ref 1.005–1.030)
pH: 8 (ref 5.0–8.0)

## 2013-03-05 LAB — CBC WITH DIFFERENTIAL/PLATELET
Eosinophils Absolute: 0.3 10*3/uL (ref 0.0–0.7)
HCT: 35.6 % — ABNORMAL LOW (ref 36.0–46.0)
Hemoglobin: 11.9 g/dL — ABNORMAL LOW (ref 12.0–15.0)
Lymphs Abs: 2.5 10*3/uL (ref 0.7–4.0)
MCH: 27.7 pg (ref 26.0–34.0)
MCHC: 33.4 g/dL (ref 30.0–36.0)
Monocytes Absolute: 0.7 10*3/uL (ref 0.1–1.0)
Monocytes Relative: 9 % (ref 3–12)
Neutro Abs: 4.1 10*3/uL (ref 1.7–7.7)
Neutrophils Relative %: 55 % (ref 43–77)
RBC: 4.3 MIL/uL (ref 3.87–5.11)

## 2013-03-05 LAB — WET PREP, GENITAL
Trich, Wet Prep: NONE SEEN
Yeast Wet Prep HPF POC: NONE SEEN

## 2013-03-05 LAB — BASIC METABOLIC PANEL
BUN: 16 mg/dL (ref 6–23)
Chloride: 105 mEq/L (ref 96–112)
Creatinine, Ser: 0.86 mg/dL (ref 0.50–1.10)
Glucose, Bld: 73 mg/dL (ref 70–99)
Potassium: 4.1 mEq/L (ref 3.5–5.1)

## 2013-03-05 MED ORDER — METRONIDAZOLE 500 MG PO TABS: 500.0000 mg | ORAL_TABLET | Freq: Two times a day (BID) | ORAL | Status: DC

## 2013-03-05 NOTE — ED Notes (Signed)
Pt states past 3 weeks has had mid to lower abdominal pain and urinary freq, denies n/v/d, denies burning w/ urination.

## 2013-03-05 NOTE — ED Provider Notes (Signed)
CSN: [id number]     Arrival date & time 03/05/13  1523 History   First MD Initiated Contact with Patient 03/05/13 1721     Chief Complaint  Patient presents with  . Abdominal Pain    HPI Pt was seen at 1725.  Per pt, c/o gradual onset and persistence of constant lower abd "pain" for the past 3 weeks.  Has been associated with urinary frequency.  Describes the abd pain as "pressure."  Denies N/V/D, no vagnal bleeding/discharge, no dysuria/hematuria, no fevers, no back pain, no rash, no CP/SOB, no black or blood in stools.      History reviewed. No pertinent past medical history.  History reviewed. No pertinent past surgical history.  Family History  Problem Relation Age of Onset  . Asthma Mother    History  Substance Use Topics  . Smoking status: Former Smoker -- 1.00 packs/day for 2 years    Types: Cigarettes    Quit date: 05/22/2008  . Smokeless tobacco: Never Used  . Alcohol Use: No   OB History   Grav Para Term Preterm Abortions TAB SAB Ect Mult Living   2 2 2             Review of Systems ROS: Statement: All systems negative except as marked or noted in the HPI; Constitutional: Negative for fever and chills. ; ; Eyes: Negative for eye pain, redness and discharge. ; ; ENMT: Negative for ear pain, hoarseness, nasal congestion, sinus pressure and sore throat. ; ; Cardiovascular: Negative for chest pain, palpitations, diaphoresis, dyspnea and peripheral edema. ; ; Respiratory: Negative for cough, wheezing and stridor. ; ; Gastrointestinal: Negative for nausea, vomiting, diarrhea, abdominal pain, blood in stool, hematemesis, jaundice and rectal bleeding. . ; ; Genitourinary: +urinary frequency. Negative for dysuria, flank pain and hematuria. ; ; GYN:  +pelvic pain. No vaginal bleeding, no vaginal discharge, no vulvar pain.;; Musculoskeletal: Negative for back pain and neck pain. Negative for swelling and trauma.; ; Skin: Negative for pruritus, rash, abrasions, blisters, bruising and  skin lesion.; ; Neuro: Negative for headache, lightheadedness and neck stiffness. Negative for weakness, altered level of consciousness , altered mental status, extremity weakness, paresthesias, involuntary movement, seizure and syncope.       Allergies  Review of patient's allergies indicates no known allergies.  Home Medications  No current outpatient prescriptions on file. BP 108/51  Pulse 69  Temp(Src) 98.3 F (36.8 C) (Oral)  Resp 18  SpO2 99%  LMP 02/11/2013 Physical Exam 1730: Physical examination:  Nursing notes reviewed; Vital signs and O2 SAT reviewed;  Constitutional: Well developed, Well nourished, Well hydrated, In no acute distress; Head:  Normocephalic, atraumatic; Eyes: EOMI, PERRL, No scleral icterus; ENMT: Mouth and pharynx normal, Mucous membranes moist; Neck: Supple, Full range of motion, No lymphadenopathy; Cardiovascular: Regular rate and rhythm, No murmur, rub, or gallop; Respiratory: Breath sounds clear & equal bilaterally, No rales, rhonchi, wheezes.  Speaking full sentences with ease, Normal respiratory effort/excursion; Chest: Nontender, Movement normal; Abdomen: Soft, +mild suprapubic tenderness to palp. No rebound or guarding. Nondistended, Normal bowel sounds; Genitourinary: No CVA tenderness. Pelvic exam performed with permission of pt and female ED tech assist during exam.  External genitalia w/o lesions. Vaginal vault with thin white discharge.  Cervix w/o lesions, not friable, GC/chlam and wet prep obtained and sent to lab.  Bimanual exam w/o CMT, uterine or adnexal tenderness.;;; Extremities: Pulses normal, No tenderness, No edema, No calf edema or asymmetry.; Neuro: AA&Ox3, Major CN grossly intact.  Speech  clear. No gross focal motor or sensory deficits in extremities. Pt texting on cellphone during my exam. Climbs on and off stretcher easily by herself. Gait steady.; Skin: Color normal, Warm, Dry.   ED Course  Procedures    MDM  MDM Reviewed: previous  chart, nursing note and vitals Reviewed previous: labs Interpretation: labs   Results for orders placed during the hospital encounter of 03/05/13  WET PREP, GENITAL      Result Value Range   Yeast Wet Prep HPF POC NONE SEEN  NONE SEEN   Trich, Wet Prep NONE SEEN  NONE SEEN   Clue Cells Wet Prep HPF POC MANY (*) NONE SEEN   WBC, Wet Prep HPF POC MANY (*) NONE SEEN  CBC WITH DIFFERENTIAL      Result Value Range   WBC 7.6  4.0 - 10.5 K/uL   RBC 4.30  3.87 - 5.11 MIL/uL   Hemoglobin 11.9 (*) 12.0 - 15.0 g/dL   HCT 16.1 (*) 09.6 - 04.5 %   MCV 82.8  78.0 - 100.0 fL   MCH 27.7  26.0 - 34.0 pg   MCHC 33.4  30.0 - 36.0 g/dL   RDW 40.9  81.1 - 91.4 %   Platelets 240  150 - 400 K/uL   Neutrophils Relative % 55  43 - 77 %   Neutro Abs 4.1  1.7 - 7.7 K/uL   Lymphocytes Relative 32  12 - 46 %   Lymphs Abs 2.5  0.7 - 4.0 K/uL   Monocytes Relative 9  3 - 12 %   Monocytes Absolute 0.7  0.1 - 1.0 K/uL   Eosinophils Relative 3  0 - 5 %   Eosinophils Absolute 0.3  0.0 - 0.7 K/uL   Basophils Relative 1  0 - 1 %   Basophils Absolute 0.1  0.0 - 0.1 K/uL  BASIC METABOLIC PANEL      Result Value Range   Sodium 138  135 - 145 mEq/L   Potassium 4.1  3.5 - 5.1 mEq/L   Chloride 105  96 - 112 mEq/L   CO2 27  19 - 32 mEq/L   Glucose, Bld 73  70 - 99 mg/dL   BUN 16  6 - 23 mg/dL   Creatinine, Ser 7.82  0.50 - 1.10 mg/dL   Calcium 9.2  8.4 - 95.6 mg/dL   GFR calc non Af Amer >90  >90 mL/min   GFR calc Af Amer >90  >90 mL/min  URINALYSIS, ROUTINE W REFLEX MICROSCOPIC      Result Value Range   Color, Urine YELLOW  YELLOW   APPearance TURBID (*) CLEAR   Specific Gravity, Urine 1.028  1.005 - 1.030   pH 8.0  5.0 - 8.0   Glucose, UA NEGATIVE  NEGATIVE mg/dL   Hgb urine dipstick NEGATIVE  NEGATIVE   Bilirubin Urine NEGATIVE  NEGATIVE   Ketones, ur NEGATIVE  NEGATIVE mg/dL   Protein, ur NEGATIVE  NEGATIVE mg/dL   Urobilinogen, UA 1.0  0.0 - 1.0 mg/dL   Nitrite NEGATIVE  NEGATIVE   Leukocytes, UA  TRACE (*) NEGATIVE  URINE MICROSCOPIC-ADD ON      Result Value Range   Squamous Epithelial / LPF FEW (*) RARE   WBC, UA 0-2  <3 WBC/hpf   RBC / HPF 0-2  <3 RBC/hpf   Urine-Other AMORPHOUS URATES/PHOSPHATES    POCT PREGNANCY, URINE      Result Value Range   Preg Test, Ur NEGATIVE  NEGATIVE  2030:  +BV, GC/chlam pending. Will tx with flagyl. Pt has gotten herself dressed and wants to go home now. Dx and testing d/w pt.  Questions answered.  Verb understanding, agreeable to d/c home with outpt f/u.        Laray Anger, DO 03/07/13 1458

## 2013-03-06 LAB — GC/CHLAMYDIA PROBE AMP: CT Probe RNA: NEGATIVE

## 2013-10-29 ENCOUNTER — Emergency Department (HOSPITAL_COMMUNITY): Payer: Medicaid Other

## 2013-10-29 ENCOUNTER — Emergency Department (HOSPITAL_COMMUNITY)
Admission: EM | Admit: 2013-10-29 | Discharge: 2013-10-29 | Disposition: A | Discharge status: Home / Self Care | Payer: Medicaid Other | Attending: Emergency Medicine | Admitting: Emergency Medicine

## 2013-10-29 ENCOUNTER — Encounter (HOSPITAL_COMMUNITY): Payer: Self-pay | Admitting: Emergency Medicine

## 2013-10-29 DIAGNOSIS — Y939 Activity, unspecified: Secondary | ICD-10-CM | POA: Insufficient documentation

## 2013-10-29 DIAGNOSIS — Y929 Unspecified place or not applicable: Secondary | ICD-10-CM | POA: Insufficient documentation

## 2013-10-29 DIAGNOSIS — S2239XA Fracture of one rib, unspecified side, initial encounter for closed fracture: Secondary | ICD-10-CM | POA: Insufficient documentation

## 2013-10-29 DIAGNOSIS — Z87891 Personal history of nicotine dependence: Secondary | ICD-10-CM | POA: Insufficient documentation

## 2013-10-29 DIAGNOSIS — IMO0002 Reserved for concepts with insufficient information to code with codable children: Secondary | ICD-10-CM | POA: Insufficient documentation

## 2013-10-29 MED ORDER — HYDROCODONE-ACETAMINOPHEN 5-325 MG PO TABS: 1.0000 | ORAL_TABLET | Freq: Four times a day (QID) | ORAL | Status: DC | PRN
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
## 2013-10-29 NOTE — Discharge Instructions (Signed)
Rib Fracture  A rib fracture is a break or crack in one of the bones of the ribs. The ribs are a group of long, curved bones that wrap around your chest and attach to your spine. They protect your lungs and other organs in the chest cavity. A broken or cracked rib is often painful, but most do not cause other problems. Most rib fractures heal on their own over time. However, rib fractures can be more serious if multiple ribs are broken or if broken ribs move out of place and push against other structures.  CAUSES   · A direct blow to the chest. For example, this could happen during contact sports, a car accident, or a fall against a hard object.  · Repetitive movements with high force, such as pitching a baseball or having severe coughing spells.  SYMPTOMS   · Pain when you breathe in or cough.  · Pain when someone presses on the injured area.  DIAGNOSIS   Your caregiver will perform a physical exam. Various imaging tests may be ordered to confirm the diagnosis and to look for related injuries. These tests may include a chest X-ray, computed tomography (CT), magnetic resonance imaging (MRI), or a bone scan.  TREATMENT   Rib fractures usually heal on their own in 1 3 months. The longer healing period is often associated with a continued cough or other aggravating activities. During the healing period, pain control is very important. Medication is usually given to control pain. Hospitalization or surgery may be needed for more severe injuries, such as those in which multiple ribs are broken or the ribs have moved out of place.   HOME CARE INSTRUCTIONS   · Avoid strenuous activity and any activities or movements that cause pain. Be careful during activities and avoid bumping the injured rib.  · Gradually increase activity as directed by your caregiver.  · Only take over-the-counter or prescription medications as directed by your caregiver. Do not take other medications without asking your caregiver first.  · Apply ice  to the injured area for the first 1 2 days after you have been treated or as directed by your caregiver. Applying ice helps to reduce inflammation and pain.  · Put ice in a plastic bag.  · Place a towel between your skin and the bag.    · Leave the ice on for 15 20 minutes at a time, every 2 hours while you are awake.  · Perform deep breathing as directed by your caregiver. This will help prevent pneumonia, which is a common complication of a broken rib. Your caregiver may instruct you to:  · Take deep breaths several times a day.  · Try to cough several times a day, holding a pillow against the injured area.  · Use a device called an incentive spirometer to practice deep breathing several times a day.  · Drink enough fluids to keep your urine clear or pale yellow. This will help you avoid constipation.    · Do not wear a rib belt or binder. These restrict breathing, which can lead to pneumonia.    SEEK IMMEDIATE MEDICAL CARE IF:   · You have a fever.    · You have difficulty breathing or shortness of breath.    · You develop a continual cough, or you cough up thick or bloody sputum.  · You feel sick to your stomach (nausea), throw up (vomit), or have abdominal pain.    · You have worsening pain not controlled with medications.      MAKE SURE YOU:  · Understand these instructions.  · Will watch your condition.  · Will get help right away if you are not doing well or get worse.  Document Released: 06/11/2005 Document Revised: 02/11/2013 Document Reviewed: 08/13/2012  [employer]® Patient Information ©2014 [employer].
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
## 2013-11-02 NOTE — ED Provider Notes (Signed)
Medical screening examination/treatment/procedure(s) were performed by non-physician practitioner and as supervising physician I was immediately available for consultation/collaboration.   EKG Interpretation None        Mervin Kung, MD 11/02/13 [phone]

## 2014-04-26 ENCOUNTER — Encounter (HOSPITAL_COMMUNITY): Payer: Self-pay | Admitting: Emergency Medicine

## 2015-01-23 ENCOUNTER — Encounter (HOSPITAL_COMMUNITY): Payer: Self-pay | Admitting: *Deleted

## 2015-01-23 ENCOUNTER — Emergency Department (HOSPITAL_COMMUNITY)
Admission: EM | Admit: 2015-01-23 | Discharge: 2015-01-23 | Disposition: A | Discharge status: Home / Self Care | Payer: Medicaid Other | Attending: Emergency Medicine | Admitting: Emergency Medicine

## 2015-01-23 ENCOUNTER — Emergency Department (HOSPITAL_COMMUNITY): Payer: Medicaid Other

## 2015-01-23 DIAGNOSIS — Z87891 Personal history of nicotine dependence: Secondary | ICD-10-CM | POA: Insufficient documentation

## 2015-01-23 DIAGNOSIS — O210 Mild hyperemesis gravidarum: Secondary | ICD-10-CM | POA: Diagnosis present

## 2015-01-23 DIAGNOSIS — Z3490 Encounter for supervision of normal pregnancy, unspecified, unspecified trimester: Secondary | ICD-10-CM

## 2015-01-23 DIAGNOSIS — Z3A01 Less than 8 weeks gestation of pregnancy: Secondary | ICD-10-CM | POA: Diagnosis not present

## 2015-01-23 DIAGNOSIS — Z91040 Latex allergy status: Secondary | ICD-10-CM | POA: Diagnosis not present

## 2015-01-23 DIAGNOSIS — Z79899 Other long term (current) drug therapy: Secondary | ICD-10-CM | POA: Diagnosis not present

## 2015-01-23 DIAGNOSIS — R110 Nausea: Secondary | ICD-10-CM

## 2015-01-23 DIAGNOSIS — R109 Unspecified abdominal pain: Secondary | ICD-10-CM

## 2015-01-23 LAB — URINE MICROSCOPIC-ADD ON

## 2015-01-23 LAB — I-STAT BETA HCG BLOOD, ED (MC, WL, AP ONLY): I-stat hCG, quantitative: >2000 m[IU]/mL — ABNORMAL HIGH (ref <5)

## 2015-01-23 LAB — URINALYSIS, ROUTINE W REFLEX MICROSCOPIC
GLUCOSE, UA: NEGATIVE mg/dL
Hgb urine dipstick: NEGATIVE
KETONES UR: 15 mg/dL — AB
Nitrite: NEGATIVE
PROTEIN: NEGATIVE mg/dL
SPECIFIC GRAVITY, URINE: 1.035 — AB (ref 1.005–1.030)
Urobilinogen, UA: 1 mg/dL (ref 0.0–1.0)
pH: 6 (ref 5.0–8.0)

## 2015-01-23 MED ORDER — ONDANSETRON 4 MG PO TBDP
4.0000 mg | ORAL_TABLET | Freq: Once | ORAL | Status: AC
  Administered 2015-01-23: 4 mg via ORAL
  Filled 2015-01-23: qty 1

## 2015-01-23 MED ORDER — DOXYLAMINE SUCCINATE (SLEEP) 25 MG PO TABS
25.0000 mg | ORAL_TABLET | Freq: Once | ORAL | Status: DC
  Filled 2015-01-23: qty 1

## 2015-01-23 MED ORDER — VITAMIN B-6 50 MG PO TABS
50.0000 mg | ORAL_TABLET | Freq: Every day | ORAL | Status: DC
  Administered 2015-01-23: 50 mg via ORAL
  Filled 2015-01-23: qty 1

## 2015-01-23 MED ORDER — DOXYLAMINE-PYRIDOXINE 10-10 MG PO TBEC: 1.0000 | DELAYED_RELEASE_TABLET | Freq: Three times a day (TID) | ORAL | Status: DC | PRN

## 2015-01-23 MED ORDER — DEXTROSE-NACL 5-0.9 % IV SOLN
Freq: Once | INTRAVENOUS | Status: AC
Start: 2015-01-23 — End: 2015-01-23
  Administered 2015-01-23: 14:00:00 via INTRAVENOUS

## 2015-01-23 NOTE — ED Notes (Signed)
Patient transported to Ultrasound 

## 2015-01-23 NOTE — ED Provider Notes (Signed)
CSN: [id number]     Arrival date & time 01/23/15  1217 History   First MD Initiated Contact with Patient 01/23/15 1310     Chief Complaint  Patient presents with  . Morning Sickness     (Consider location/radiation/quality/duration/timing/severity/associated sxs/prior Treatment) Patient is a 24 y.o. female presenting with general illness. The history is provided by the patient.  Illness Location:  Diffuse Quality:  Nausea Severity:  Mild Onset quality:  Gradual Duration:  2 weeks Timing:  Constant Progression:  Unchanged Chronicity:  New Context:  She is about [redacted] weeks pregnant Relieved by:  Nothing Worsened by:  Nothing Associated symptoms: nausea   Associated symptoms: no abdominal pain, no cough, no fever, no shortness of breath and no vomiting     History reviewed. No pertinent past medical history. History reviewed. No pertinent past surgical history. Family History  Problem Relation Age of Onset  . Asthma Mother    History  Substance Use Topics  . Smoking status: Former Smoker -- 1.00 packs/day for 2 years    Types: Cigarettes    Quit date: 05/22/2008  . Smokeless tobacco: Never Used  . Alcohol Use: No   OB History    Gravida Para Term Preterm AB TAB SAB Ectopic Multiple Living   2 2 2             Review of Systems  Constitutional: Negative for fever.  Respiratory: Negative for cough and shortness of breath.   Gastrointestinal: Positive for nausea. Negative for vomiting and abdominal pain.  All other systems reviewed and are negative.     Allergies  Latex  Home Medications   Prior to Admission medications   Medication Sig Start Date End Date Taking? Authorizing Provider  Prenatal Vit-Fe Fumarate-FA (PRENATAL VITAMIN PO) Take 1 tablet by mouth daily.   Yes Historical Provider, MD   BP 121/65 mmHg  Pulse 68  Temp(Src) 98.4 F (36.9 C) (Oral)  Resp 18  Ht 5\' 4"  (1.626 m)  Wt 120 lb (54.432 kg)  BMI 20.59 kg/m2  SpO2 97%  LMP  11/30/2014 Physical Exam  Constitutional: She is oriented to person, place, and time. She appears well-developed and well-nourished. No distress.  HENT:  Head: Normocephalic and atraumatic.  Mouth/Throat: Oropharynx is clear and moist.  Eyes: EOM are normal. Pupils are equal, round, and reactive to light.  Neck: Normal range of motion. Neck supple.  Cardiovascular: Normal rate and regular rhythm.  Exam reveals no friction rub.   No murmur heard. Pulmonary/Chest: Effort normal and breath sounds normal. No respiratory distress. She has no wheezes. She has no rales.  Abdominal: Soft. She exhibits no distension. There is no tenderness. There is no rebound.  Musculoskeletal: Normal range of motion. She exhibits no edema.  Neurological: She is alert and oriented to person, place, and time.  Skin: Skin is warm. No rash noted. She is not diaphoretic.  Nursing note and vitals reviewed.   ED Course  Procedures (including critical care time) Labs Review Labs Reviewed  I-STAT BETA HCG BLOOD, ED ([facility], [facility], [facility] ONLY) - Abnormal; Notable for the following:    I-stat hCG, quantitative >2000.0 (*)    All other components within normal limits  URINALYSIS, ROUTINE W REFLEX MICROSCOPIC (NOT AT [facility])    Imaging Review US Ob Comp Less 14 Wks  01/23/2015   CLINICAL DATA:  24 year old pregnant female with abdominal and pelvic pain for 2 weeks.  EXAM: OBSTETRIC <14 WK [place] AND TRANSVAGINAL OB US  TECHNIQUE: Both  transabdominal and transvaginal ultrasound examinations were performed for complete evaluation of the gestation as well as the maternal uterus, adnexal regions, and pelvic cul-de-sac. Transvaginal technique was performed to assess early pregnancy.  COMPARISON:  None.  FINDINGS: Intrauterine gestational sac: Visualized/normal in shape.  Yolk sac:  Visualized  Embryo:  Visualized  Cardiac Activity: Visualized  Heart Rate: 152  bpm  CRL:  5.7  mm   6 w   3 d                  [place] EDC: 09/15/2015  Maternal  uterus/adnexae: No subchorionic hemorrhage identified.  The ovaries are unremarkable.  No free fluid or adnexal mass.  IMPRESSION: Single living intrauterine gestation with estimated gestational age of [redacted] weeks 3 days by this ultrasound. No evidence of subchorionic hemorrhage.   Electronically Signed   By: Margarette Canada M.D.   On: 01/23/2015 15:20   US Ob Transvaginal  01/23/2015   CLINICAL DATA:  24 year old pregnant female with abdominal and pelvic pain for 2 weeks.  EXAM: OBSTETRIC <14 WK [place] AND TRANSVAGINAL OB US  TECHNIQUE: Both transabdominal and transvaginal ultrasound examinations were performed for complete evaluation of the gestation as well as the maternal uterus, adnexal regions, and pelvic cul-de-sac. Transvaginal technique was performed to assess early pregnancy.  COMPARISON:  None.  FINDINGS: Intrauterine gestational sac: Visualized/normal in shape.  Yolk sac:  Visualized  Embryo:  Visualized  Cardiac Activity: Visualized  Heart Rate: 152  bpm  CRL:  5.7  mm   6 w   3 d                  [place] EDC: 09/15/2015  Maternal uterus/adnexae: No subchorionic hemorrhage identified.  The ovaries are unremarkable.  No free fluid or adnexal mass.  IMPRESSION: Single living intrauterine gestation with estimated gestational age of [redacted] weeks 3 days by this ultrasound. No evidence of subchorionic hemorrhage.   Electronically Signed   By: Margarette Canada M.D.   On: 01/23/2015 15:20     EKG Interpretation None      MDM   Final diagnoses:  Pregnancy  Nausea    23F here with nausea. No vomiting. Roughly [redacted] weeks pregnant. No prior morning sickness with her prior pregnancies. She is worried about the baby since she hasn't eaten. No fevers, dysuria. No abdominal pain.  AFVSS here. Well appearing. No abdominal pain.  [place] negative. UA contaminated. Given diclegis for nausea. Stable for discharge. Given OB f/u.    Evelina Bucy, MD 01/23/15 [phone]

## 2015-01-23 NOTE — Discharge Instructions (Signed)

## 2015-01-23 NOTE — ED Notes (Addendum)
Pt states she is approx [redacted] weeks pregnant.  She is coming here for a pre-natal check-up.  States the smell of food makes her nauseated and she can't eat.  Denies vaginal bleeding but states abdominal pain x 2 weeks.

## 2015-02-15 LAB — OB RESULTS CONSOLE GC/CHLAMYDIA
Chlamydia: NEGATIVE
GC PROBE AMP, GENITAL: NEGATIVE

## 2015-02-15 LAB — OB RESULTS CONSOLE RUBELLA ANTIBODY, IGM: Rubella: IMMUNE

## 2015-02-15 LAB — OB RESULTS CONSOLE HIV ANTIBODY (ROUTINE TESTING): HIV: NONREACTIVE

## 2015-02-15 LAB — OB RESULTS CONSOLE ANTIBODY SCREEN: ANTIBODY SCREEN: NEGATIVE

## 2015-02-15 LAB — OB RESULTS CONSOLE ABO/RH: RH Type: POSITIVE

## 2015-02-15 LAB — OB RESULTS CONSOLE RPR: RPR: NONREACTIVE

## 2015-02-15 LAB — OB RESULTS CONSOLE HEPATITIS B SURFACE ANTIGEN: HEP B S AG: NEGATIVE

## 2015-02-16 ENCOUNTER — Encounter (HOSPITAL_COMMUNITY): Payer: Self-pay

## 2015-02-16 ENCOUNTER — Inpatient Hospital Stay (HOSPITAL_COMMUNITY)
Admission: AD | Admit: 2015-02-16 | Discharge: 2015-02-17 | Disposition: A | Discharge status: Home / Self Care | Payer: Medicaid Other | Source: Ambulatory Visit | Attending: Obstetrics and Gynecology | Admitting: Obstetrics and Gynecology

## 2015-02-16 DIAGNOSIS — A599 Trichomoniasis, unspecified: Secondary | ICD-10-CM | POA: Diagnosis not present

## 2015-02-16 DIAGNOSIS — O219 Vomiting of pregnancy, unspecified: Secondary | ICD-10-CM | POA: Insufficient documentation

## 2015-02-16 DIAGNOSIS — G44209 Tension-type headache, unspecified, not intractable: Secondary | ICD-10-CM | POA: Diagnosis not present

## 2015-02-16 DIAGNOSIS — O26891 Other specified pregnancy related conditions, first trimester: Secondary | ICD-10-CM | POA: Diagnosis not present

## 2015-02-16 DIAGNOSIS — R102 Pelvic and perineal pain: Secondary | ICD-10-CM | POA: Diagnosis present

## 2015-02-16 LAB — WET PREP, GENITAL
Clue Cells Wet Prep HPF POC: NONE SEEN
YEAST WET PREP: NONE SEEN

## 2015-02-16 MED ORDER — CYCLOBENZAPRINE HCL 5 MG PO TABS
5.0000 mg | ORAL_TABLET | Freq: Once | ORAL | Status: AC
  Administered 2015-02-16: 5 mg via ORAL
  Filled 2015-02-16: qty 1

## 2015-02-16 MED ORDER — ACETAMINOPHEN 325 MG PO TABS
650.0000 mg | ORAL_TABLET | ORAL | Status: AC
  Administered 2015-02-16: 650 mg via ORAL
  Filled 2015-02-16: qty 2

## 2015-02-16 MED ORDER — PROMETHAZINE HCL 25 MG PO TABS
25.0000 mg | ORAL_TABLET | ORAL | Status: AC
  Administered 2015-02-16: 25 mg via ORAL
  Filled 2015-02-16: qty 1

## 2015-02-16 NOTE — MAU Provider Note (Signed)
History   24 yo G3P2002 at 10 5/7 weeks by LMP presented after calling with suprapubic pain starting tonight, and nagging temporal HA for several days, with the suprapubic pain worsening tonight.  Denies leaking, bleeding, dysuria, fever, visual sx, neurological sx, or back pain.  Has struggled with nausea/vomiting during pregnancy--given Diclegis for nausea, but insurance does not cover, so she has not been on any med for nausea.  Has not taken anything for her HA.  Hx of headaches in past, but has had more since pregnant.  Seen at [place] yesterday for NOB interview, with cultures and PN labs pending from that visit.  Patient Active Problem List   Diagnosis Date Noted  . 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 Abdominal Pain  . Headache   HPI:  As above  OB History    Gravida Para Term Preterm AB TAB SAB Ectopic Multiple Living   3 2 2              History reviewed. No pertinent past medical history.  History reviewed. No pertinent past surgical history.  Family History  Problem Relation Age of Onset  . Asthma Mother     Social History  Substance Use Topics  . Smoking status: Former Smoker -- 1.00 packs/day for 2 years    Types: Cigarettes    Quit date: 05/22/2008  . Smokeless tobacco: Never Used  . Alcohol Use: No    Allergies:  Allergies  Allergen Reactions  . Latex Hives and Rash    Prescriptions prior to admission  Medication Sig Dispense Refill Last Dose  . Doxylamine-Pyridoxine (DICLEGIS) 10-10 MG TBEC Take 1 tablet by mouth every 8 (eight) hours as needed. 60 tablet 0   . Prenatal Vit-Fe Fumarate-FA (PRENATAL VITAMIN PO) Take 1 tablet by mouth daily.   01/23/2015 at Unknown time    ROS:  Suprapubic/mons pain, temporal HA, nausea Physical Exam   Blood pressure 111/67, pulse 55, temperature 98 F (36.7 C), temperature source Oral, resp. rate 18, last menstrual period 12/03/2014.    Physical Exam  In NAD Chest  clear Heart RRR without murmur Abd gravid, no rebound or guarding Pelvic--uterus 12 week size, mildly tender in suprapubic area.  Moderate thin white d/c in vault.  Cervix closed, posterior. No CVAT. Ext WNL  FHR 172  ED Course  Assessment: IUP at 10 5/7 weeks Pelvic pain HA N/V  Plan: Acetaminophen 650 mg now Flexeril 5 mg po now Phenergan 25 mg now. UA Wet prep   Donnel Saxon CNM, MSN 02/16/2015 11:24 PM  Addendum:  Aroused from sleep to discuss plan of care. Results for orders placed or performed during the hospital encounter of 02/16/15 (from the past 24 hour(s))  Wet prep, genital     Status: Abnormal   Collection Time: 02/16/15 11:10 PM  Result Value Ref Range   Yeast Wet Prep HPF POC NONE SEEN NONE SEEN   Trich, Wet Prep FEW (A) NONE SEEN   Clue Cells Wet Prep HPF POC NONE SEEN NONE SEEN   WBC, Wet Prep HPF POC FEW (A) NONE SEEN  Urinalysis, Routine w reflex microscopic (not at [facility])     Status: Abnormal   Collection Time: 02/16/15 11:54 PM  Result Value Ref Range   Color, Urine YELLOW YELLOW   APPearance CLEAR CLEAR   Specific Gravity, Urine 1.025 1.005 - 1.030   pH 6.0 5.0 - 8.0   Glucose, UA NEGATIVE NEGATIVE mg/dL  Hgb urine dipstick NEGATIVE NEGATIVE   Bilirubin Urine NEGATIVE NEGATIVE   Ketones, ur 15 (A) NEGATIVE mg/dL   Protein, ur NEGATIVE NEGATIVE mg/dL   Urobilinogen, UA 1.0 0.0 - 1.0 mg/dL   Nitrite NEGATIVE NEGATIVE   Leukocytes, UA TRACE (A) NEGATIVE  Urine microscopic-add on     Status: Abnormal   Collection Time: 02/16/15 11:54 PM  Result Value Ref Range   Squamous Epithelial / LPF FEW (A) RARE   WBC, UA 3-6 <3 WBC/hpf   Bacteria, UA RARE RARE   Urine-Other MUCOUS PRESENT    Filed Vitals:   02/16/15 2301  BP: 111/67  Pulse: 55  Temp: 98 F (36.7 C)  TempSrc: Oral  Resp: 18    Impression: IUP at 10 6/7 weeks Trichomonas Tension HA Pelvic pain N/V of pregnancy  Plan: D/c home with information on trichomonas and  tension HAs. Discussed partner treatment--previous partner not involved Rx MTZ 2 gm dose. Rx Flexeril 5 mg po TID prn HA/muscle pain, 1 refill Rx Phenergan 25 mg po q 6 hours prn N/V, 2 refills F/u as scheduled at [place] or prn.  Donnel Saxon, CNM 02/17/15 12:50a

## 2015-02-16 NOTE — MAU Note (Signed)
Pt presents complaining of lower abdominal pain that started this am. Rates the pain 9/10. Also has a migraine headache. Has not tried pain medication for head or abdomen. Denies vaginal bleeding or discharge.

## 2015-02-17 LAB — URINALYSIS, ROUTINE W REFLEX MICROSCOPIC
Bilirubin Urine: NEGATIVE
Glucose, UA: NEGATIVE mg/dL
Hgb urine dipstick: NEGATIVE
Ketones, ur: 15 mg/dL — AB
NITRITE: NEGATIVE
PH: 6 (ref 5.0–8.0)
Protein, ur: NEGATIVE mg/dL
SPECIFIC GRAVITY, URINE: 1.025 (ref 1.005–1.030)
Urobilinogen, UA: 1 mg/dL (ref 0.0–1.0)

## 2015-02-17 LAB — URINE MICROSCOPIC-ADD ON

## 2015-02-17 MED ORDER — PROMETHAZINE HCL 25 MG PO TABS: 25.0000 mg | ORAL_TABLET | Freq: Four times a day (QID) | ORAL | Status: DC | PRN

## 2015-02-17 MED ORDER — METRONIDAZOLE 500 MG PO TABS: 2000.0000 mg | ORAL_TABLET | Freq: Once | ORAL | Status: DC

## 2015-02-17 MED ORDER — CYCLOBENZAPRINE HCL 5 MG PO TABS: 5.0000 mg | ORAL_TABLET | Freq: Three times a day (TID) | ORAL | Status: DC | PRN

## 2015-02-17 NOTE — Discharge Instructions (Signed)
Trichomoniasis Trichomoniasis is an infection caused by an organism called Trichomonas. The infection can affect both women and men. In women, the outer female genitalia and the vagina are affected. In men, the penis is mainly affected, but the prostate and other reproductive organs can also be involved. Trichomoniasis is a sexually transmitted infection (STI) and is most often passed to another person through sexual contact.  RISK FACTORS  Having unprotected sexual intercourse.  Having sexual intercourse with an infected partner. SIGNS AND SYMPTOMS  Symptoms of trichomoniasis in women include:  Abnormal gray-green frothy vaginal discharge.  Itching and irritation of the vagina.  Itching and irritation of the area outside the vagina. Symptoms of trichomoniasis in men include:   Penile discharge with or without pain.  Pain during urination. This results from inflammation of the urethra. DIAGNOSIS  Trichomoniasis may be found during a Pap test or physical exam. Your health care provider may use one of the following methods to help diagnose this infection:  Examining vaginal discharge under a microscope. For men, urethral discharge would be examined.  Testing the pH of the vagina with a test tape.  Using a vaginal swab test that checks for the Trichomonas organism. A test is available that provides results within a few minutes.  Doing a culture test for the organism. This is not usually needed. TREATMENT   You may be given medicine to fight the infection. Women should inform their health care provider if they could be or are pregnant. Some medicines used to treat the infection should not be taken during pregnancy.  Your health care provider may recommend over-the-counter medicines or creams to decrease itching or irritation.  Your sexual partner will need to be treated if infected. HOME CARE INSTRUCTIONS   Take medicines only as directed by your health care provider.  Take  over-the-counter medicine for itching or irritation as directed by your health care provider.  Do not have sexual intercourse while you have the infection.  Women should not douche or wear tampons while they have the infection.  Discuss your infection with your partner. Your partner may have gotten the infection from you, or you may have gotten it from your partner.  Have your sex partner get examined and treated if necessary.  Practice safe, informed, and protected sex.  See your health care provider for other STI testing. SEEK MEDICAL CARE IF:   You still have symptoms after you finish your medicine.  You develop abdominal pain.  You have pain when you urinate.  You have bleeding after sexual intercourse.  You develop a rash.  Your medicine makes you sick or makes you throw up (vomit). MAKE SURE YOU:  Understand these instructions.  Will watch your condition.  Will get help right away if you are not doing well or get worse. Document Released: 12/05/2000 Document Revised: 10/26/2013 Document Reviewed: 03/23/2013 [facility] Patient Information 2015 [place], [place]. This information is not intended to replace advice given to you by your health care provider. Make sure you discuss any questions you have with your health care provider.  Tension Headache A tension headache is a feeling of pain, pressure, or aching often felt over the front and sides of the head. The pain can be dull or can feel tight (constricting). It is the most common type of headache. Tension headaches are not normally associated with nausea or vomiting and do not get worse with physical activity. Tension headaches can last 30 minutes to several days.  CAUSES  The exact  cause is not known, but it may be caused by chemicals and hormones in the brain that lead to pain. Tension headaches often begin after stress, anxiety, or depression. Other triggers may include:  Alcohol.  Caffeine (too much or  withdrawal).  Respiratory infections (colds, flu, sinus infections).  Dental problems or teeth clenching.  Fatigue.  Holding your head and neck in one position too long while using a computer. SYMPTOMS   Pressure around the head.   Dull, aching head pain.   Pain felt over the front and sides of the head.   Tenderness in the muscles of the head, neck, and shoulders. DIAGNOSIS  A tension headache is often diagnosed based on:   Symptoms.   Physical examination.   A CT scan or MRI of your head. These tests may be ordered if symptoms are severe or unusual. TREATMENT  Medicines may be given to help relieve symptoms.  HOME CARE INSTRUCTIONS   Only take over-the-counter or prescription medicines for pain or discomfort as directed by your caregiver.   Lie down in a dark, quiet room when you have a headache.   Keep a journal to find out what may be triggering your headaches. For example, write down:  What you eat and drink.  How much sleep you get.  Any change to your diet or medicines.  Try massage or other relaxation techniques.   Ice packs or heat applied to the head and neck can be used. Use these 3 to 4 times per day for 15 to 20 minutes each time, or as needed.   Limit stress.   Sit up straight, and do not tense your muscles.   Quit smoking if you smoke.  Limit alcohol use.  Decrease the amount of caffeine you drink, or stop drinking caffeine.  Eat and exercise regularly.  Get 7 to 9 hours of sleep, or as recommended by your caregiver.  Avoid excessive use of pain medicine as recurrent headaches can occur.  SEEK MEDICAL CARE IF:   You have problems with the medicines you were prescribed.  Your medicines do not work.  You have a change from the usual headache.  You have nausea or vomiting. SEEK IMMEDIATE MEDICAL CARE IF:   Your headache becomes severe.  You have a fever.  You have a stiff neck.  You have loss of vision.  You have  muscular weakness or loss of muscle control.  You lose your balance or have trouble walking.  You feel faint or pass out.  You have severe symptoms that are different from your first symptoms. MAKE SURE YOU:   Understand these instructions.  Will watch your condition.  Will get help right away if you are not doing well or get worse. Document Released: 06/11/2005 Document Revised: 09/03/2011 Document Reviewed: 06/01/2011 [facility] Patient Information 2015 [place], [place]. This information is not intended to replace advice given to you by your health care provider. Make sure you discuss any questions you have with your health care provider.

## 2015-03-31 ENCOUNTER — Encounter (HOSPITAL_COMMUNITY): Payer: Self-pay

## 2015-03-31 ENCOUNTER — Inpatient Hospital Stay (HOSPITAL_COMMUNITY)
Admission: AD | Admit: 2015-03-31 | Discharge: 2015-03-31 | Disposition: A | Discharge status: Home / Self Care | Payer: Medicaid Other | Source: Ambulatory Visit | Attending: Obstetrics and Gynecology | Admitting: Obstetrics and Gynecology

## 2015-03-31 DIAGNOSIS — G43909 Migraine, unspecified, not intractable, without status migrainosus: Secondary | ICD-10-CM | POA: Diagnosis present

## 2015-03-31 DIAGNOSIS — Z3A16 16 weeks gestation of pregnancy: Secondary | ICD-10-CM | POA: Diagnosis not present

## 2015-03-31 DIAGNOSIS — O26892 Other specified pregnancy related conditions, second trimester: Secondary | ICD-10-CM | POA: Diagnosis not present

## 2015-03-31 DIAGNOSIS — G43C0 Periodic headache syndromes in child or adult, not intractable: Secondary | ICD-10-CM

## 2015-03-31 DIAGNOSIS — R51 Headache: Secondary | ICD-10-CM | POA: Diagnosis present

## 2015-03-31 LAB — URINALYSIS, ROUTINE W REFLEX MICROSCOPIC
Bilirubin Urine: NEGATIVE
Glucose, UA: NEGATIVE mg/dL
Hgb urine dipstick: NEGATIVE
Ketones, ur: 15 mg/dL — AB
LEUKOCYTES UA: NEGATIVE
NITRITE: NEGATIVE
Protein, ur: NEGATIVE mg/dL
SPECIFIC GRAVITY, URINE: 1.015 (ref 1.005–1.030)
UROBILINOGEN UA: 1 mg/dL (ref 0.0–1.0)
pH: 7.5 (ref 5.0–8.0)

## 2015-03-31 MED ORDER — LACTATED RINGERS IV BOLUS (SEPSIS)
500.0000 mL | Freq: Once | INTRAVENOUS | Status: AC
  Administered 2015-03-31: 500 mL via INTRAVENOUS

## 2015-03-31 MED ORDER — IBUPROFEN 600 MG PO TABS: 600.0000 mg | ORAL_TABLET | Freq: Four times a day (QID) | ORAL | Status: DC | PRN

## 2015-03-31 MED ORDER — LACTATED RINGERS IV SOLN: INTRAVENOUS | Status: DC

## 2015-03-31 MED ORDER — METOCLOPRAMIDE HCL 5 MG/ML IJ SOLN
10.0000 mg | Freq: Once | INTRAMUSCULAR | Status: AC
  Administered 2015-03-31: 10 mg via INTRAVENOUS
  Filled 2015-03-31: qty 2

## 2015-03-31 MED ORDER — NALBUPHINE HCL 10 MG/ML IJ SOLN
10.0000 mg | Freq: Once | INTRAMUSCULAR | Status: AC
  Administered 2015-03-31: 10 mg via INTRAVENOUS
  Filled 2015-03-31: qty 1

## 2015-03-31 NOTE — MAU Note (Signed)
Notified provider of patient who presents with headache for 3 days.

## 2015-03-31 NOTE — MAU Note (Signed)
Pt presents to MAU with complaints of a headache for three days. Denies any vaginal bleeding or discharge

## 2015-03-31 NOTE — MAU Provider Note (Signed)
  History   24 yo G3P2002 at 63 6/7 weeks presented unannounced c/o temporal HA x 3 days, varying from side to side.  Denies N/V, bleeding, dysuria, pelvic pain.  Hx migraines in past, few since pregnant.  Has tried Tylenol without benefit.  Rx'd Flexeril 02/16/15 for tension HA, but has not helped this HA. Describes as 3-4/10, but appears to feel bad, with photosensitivity.  Patient Active Problem List   Diagnosis Date Noted  . 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 Headache   HPI:  See above  OB History    Gravida Para Term Preterm AB TAB SAB Ectopic Multiple Living   3 2 2       2       History reviewed. No pertinent past medical history.  History reviewed. No pertinent past surgical history.  Family History  Problem Relation Age of Onset  . Asthma Mother     Social History  Substance Use Topics  . Smoking status: Former Smoker -- 1.00 packs/day for 2 years    Types: Cigarettes    Quit date: 05/22/2008  . Smokeless tobacco: Never Used  . Alcohol Use: No    Allergies:  Allergies  Allergen Reactions  . Latex Hives and Rash    Prescriptions prior to admission  Medication Sig Dispense Refill Last Dose  . cyclobenzaprine (FLEXERIL) 5 MG tablet Take 1 tablet (5 mg total) by mouth 3 (three) times daily as needed for muscle spasms. 30 tablet 1   . metroNIDAZOLE (FLAGYL) 500 MG tablet Take 4 tablets (2,000 mg total) by mouth once. 4 tablet 0   . Prenatal Vit-Fe Fumarate-FA (PRENATAL VITAMIN PO) Take 1 tablet by mouth daily.   01/23/2015 at Unknown time  . promethazine (PHENERGAN) 25 MG tablet Take 1 tablet (25 mg total) by mouth every 6 (six) hours as needed for nausea or vomiting. 30 tablet 2     ROS:  Temporal HA Physical Exam   Blood pressure 104/55, pulse 74, temperature 98.2 F (36.8 C), resp. rate 18, height 5\' 3"  (1.6 m), weight 57.153 kg (126 lb), last menstrual period 12/03/2014.    Physical Exam  Photosensitive  due to HA Chest clear  Heart RRR without murmur Abd gravid, NT Pelvic--deferred Ext WNL  FHR 147 bpm  ED Course  Assessment: IUP at 16 6/7 weeks Migraine HA  Plan: IV hydration Reglan Nubain Anticipate sending home with Ibuprophen Rx. Gavin Pound, CNM, will f/u on patient status.   Donnel Saxon CNM, MSN 03/31/2015 6:12 PM

## 2015-03-31 NOTE — Discharge Instructions (Signed)
Recurrent Migraine Headache °A migraine headache is very bad, throbbing pain on one or both sides of your head. Recurrent migraines keep coming back. Talk to your doctor about what things may bring on (trigger) your migraine headaches. °HOME CARE °· Only take medicines as told by your doctor. °· Lie down in a dark, quiet room when you have a migraine. °· Keep a journal to find out if certain things bring on migraine headaches. For example, write down: °¨ What you eat and drink. °¨ How much sleep you get. °¨ Any change to your diet or medicines. °· Lessen how much alcohol you drink. °· Quit smoking if you smoke. °· Get enough sleep. °· Lessen any stress in your life. °· Keep lights dim if bright lights bother you or make your migraines worse. °GET HELP IF: °· Medicine does not help your migraines. °· Your pain keeps coming back. °· You have a fever. °GET HELP RIGHT AWAY IF:  °· Your migraine becomes really bad. °· You have a stiff neck. °· You have trouble seeing. °· Your muscles are weak, or you lose muscle control. °· You lose your balance or have trouble walking. °· You feel like you will pass out (faint), or you pass out. °· You have really bad symptoms that are different than your first symptoms. °MAKE SURE YOU:  °· Understand these instructions. °· Will watch your condition. °· Will get help right away if you are not doing well or get worse. °  °This information is not intended to replace advice given to you by your health care provider. Make sure you discuss any questions you have with your health care provider. °  °Document Released: 03/20/2008 Document Revised: 06/16/2013 Document Reviewed: 02/16/2013 °Elsevier Interactive Patient Education ©2016 [employer] ° °

## 2015-03-31 NOTE — MAU Provider Note (Signed)
Melinda Barnes  [id number]  S: Follow up and completion of MAU visit started by V. Cira Servant, CNM.  Patient presented with c/o headache.  Patient now s/p IV reglan, nubain, and hydration.  Reports feeling "way better."    O:  Filed Vitals:   03/31/15 1717  BP: 104/55  Pulse: 74  Temp: 98.2 F (36.8 C)  Resp: 18   Results for orders placed or performed during the hospital encounter of 03/31/15 (from the past 24 hour(s))  Urinalysis, Routine w reflex microscopic (not at [facility])     Status: Abnormal   Collection Time: 03/31/15  5:53 PM  Result Value Ref Range   Color, Urine YELLOW YELLOW   APPearance CLEAR CLEAR   Specific Gravity, Urine 1.015 1.005 - 1.030   pH 7.5 5.0 - 8.0   Glucose, UA NEGATIVE NEGATIVE mg/dL   Hgb urine dipstick NEGATIVE NEGATIVE   Bilirubin Urine NEGATIVE NEGATIVE   Ketones, ur 15 (A) NEGATIVE mg/dL   Protein, ur NEGATIVE NEGATIVE mg/dL   Urobilinogen, UA 1.0 0.0 - 1.0 mg/dL   Nitrite NEGATIVE NEGATIVE   Leukocytes, UA NEGATIVE NEGATIVE   A: IUP at 16.6wks Migraine HA-Non Intractable   P: Rx for ibuprofen 600mg  Q 6 hrs, prn.  Disp 30, RF 2 Educated regarding ibuprofen usage during pregnancy.  Advised to discontinue usage at 26wks Questions and concerns addressed Bleeding and PTL precautions discussed Instructed to keep regularly scheduled OB appt for 10/27 Encouraged to call if any questions or concerns arise prior to next scheduled office visit.  Discharged to home in improved condition  Sedalia Muta MSN, CNM 03/31/2015 8:20 PM

## 2015-05-01 ENCOUNTER — Encounter (HOSPITAL_COMMUNITY): Payer: Self-pay | Admitting: *Deleted

## 2015-05-01 ENCOUNTER — Inpatient Hospital Stay (HOSPITAL_COMMUNITY)
Admission: AD | Admit: 2015-05-01 | Discharge: 2015-05-01 | Disposition: A | Discharge status: Home / Self Care | Payer: Medicaid Other | Source: Ambulatory Visit | Attending: Obstetrics and Gynecology | Admitting: Obstetrics and Gynecology

## 2015-05-01 DIAGNOSIS — O26892 Other specified pregnancy related conditions, second trimester: Secondary | ICD-10-CM | POA: Insufficient documentation

## 2015-05-01 DIAGNOSIS — Z87891 Personal history of nicotine dependence: Secondary | ICD-10-CM | POA: Insufficient documentation

## 2015-05-01 DIAGNOSIS — N6452 Nipple discharge: Secondary | ICD-10-CM | POA: Diagnosis present

## 2015-05-01 DIAGNOSIS — A599 Trichomoniasis, unspecified: Secondary | ICD-10-CM | POA: Diagnosis present

## 2015-05-01 DIAGNOSIS — Z91040 Latex allergy status: Secondary | ICD-10-CM | POA: Diagnosis present

## 2015-05-01 DIAGNOSIS — Z3A21 21 weeks gestation of pregnancy: Secondary | ICD-10-CM | POA: Insufficient documentation

## 2015-05-01 HISTORY — DX: Other specified health status: Z78.9

## 2015-05-01 NOTE — MAU Provider Note (Signed)
  History   24 yo G3P2002 at 21 2/7 weeks presented unannounced c/o blood leaking from both breasts.  Sx began about 2 weeks ago--can occur spontaneously or when nipple compressed.  Reports right breast extrudes BRB, and left extrudes darker red blood.  No known nipple trauma.   No breast pain.  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 Left breast US 06/11/11 for palpable mass in left breast--dx probably fibroadenoma  Patient Active Problem List   Diagnosis Date Noted  . Migraine 03/31/2015  . 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 Blood Leaking From Both Breasts    HPI:  As above  OB History    Gravida Para Term Preterm AB TAB SAB Ectopic Multiple Living   3 2 2       2       Past Medical History  Diagnosis Date  . Medical history non-contributory     Past Surgical History  Procedure Laterality Date  . No past surgeries      Family History  Problem Relation Age of Onset  . Asthma Mother     Social History  Substance Use Topics  . Smoking status: Former Smoker -- 1.00 packs/day for 2 years    Types: Cigarettes    Quit date: 05/22/2008  . Smokeless tobacco: Never Used  . Alcohol Use: No    Allergies:  Allergies  Allergen Reactions  . Latex Hives and Rash    Prescriptions prior to admission  Medication Sig Dispense Refill Last Dose  . cyclobenzaprine (FLEXERIL) 5 MG tablet Take 1 tablet (5 mg total) by mouth 3 (three) times daily as needed for muscle spasms. (Patient not taking: Reported on 05/01/2015) 30 tablet 1 Past Week at Unknown time  . ibuprofen (ADVIL,MOTRIN) 600 MG tablet Take 1 tablet (600 mg total) by mouth every 6 (six) hours as needed for headache. (Patient not taking: Reported on 05/01/2015) 30 tablet 2     ROS:  Bloody d/c from both breasts Physical Exam   Blood pressure 96/51, pulse 68,  temperature 98.3 F (36.8 C), temperature source Oral, resp. rate 18, height 5\' 3"  (1.6 m), weight 63.22 kg (139 lb 6 oz), last menstrual period 12/03/2014.   Physical Exam  In NAD Chest clear Heart RRR without murmur Breasts--bilaterally soft, fibrous tissue distributed throughout both breasts, NT, no skin changes.  Able to extrude clear liquid from single opening on left nipple with minimal aereolar compression, small amount BRB from single opening on right nipple.  No obvious nipple trauma. Abd gravid, NT Pelvic--deferred Ext WNL  FHR 150 UCs none  ED Course  Assessment: IUP at 21 2/7 weeks Bilateral bloody nipple drainage Hx fibroadenomas bilaterally  Plan: D/C home with instructions to avoid nipple/breast stimulation Will have office refer patient to [place] for further assessment. Keep scheduled ROB appt 05/18/15, call prn.  Donnel Saxon CNM, MSN 05/01/2015 12:11 PM

## 2015-05-01 NOTE — MAU Note (Signed)
Patient presents at [redacted] weeks gestation with c/o blood leaking from both breasts. Fetus active. Denies pain, bleeding or discharge.

## 2015-05-01 NOTE — Discharge Instructions (Signed)
Galactorrhea  Galactorrhea is the flow of a milky fluid (discharge) from the breast. It is different from normal milk in nursing mothers. It can be caused by many things. Most cases are not serious and do not require treatment. Watch your condition to make sure it goes away.  During pregnancy, there can be increased discharge from the breasts.   HOME CARE  Take medicines only as told by your doctor.  Do not squeeze your breasts or nipples.  Avoid any touching of your breasts during sexual activity.  Perform a breast self-exam only once a month.  Avoid clothes that rub on your nipples.  Use breast pads to absorb the fluid.  Wear a support bra or a breast binder.  Keep all follow-up visits as told by your doctor. This is important. GET HELP IF:  You have hot flashes.  You have vaginal dryness.  You have a lack of sexual desire.  You stop having menstrual periods, or they are far apart or not regular.  You have headaches.  You have vision problems. GET HELP RIGHT AWAY IF:  Your breast discharge is yellowish white (puslike).  You have breast pain.  You feel a lump in your breast.  Your breast shows wrinkling or dimpling.  Your breast becomes red and swollen.   This information is not intended to replace advice given to you by your health care provider. Make sure you discuss any questions you have with your health care provider.   Document Released: 07/14/2010 Document Revised: 07/02/2014 Document Reviewed: 01/12/2014 Elsevier Interactive Patient Education [employer].

## 2015-05-10 ENCOUNTER — Other Ambulatory Visit: Payer: Self-pay | Admitting: Obstetrics and Gynecology

## 2015-05-10 DIAGNOSIS — N6452 Nipple discharge: Secondary | ICD-10-CM

## 2015-05-13 ENCOUNTER — Ambulatory Visit
Admission: RE | Admit: 2015-05-13 | Discharge: 2015-05-13 | Disposition: A | Discharge status: Home / Self Care | Payer: Medicaid Other | Source: Ambulatory Visit | Attending: Obstetrics and Gynecology | Admitting: Obstetrics and Gynecology

## 2015-05-13 DIAGNOSIS — N6452 Nipple discharge: Secondary | ICD-10-CM

## 2015-06-12 ENCOUNTER — Inpatient Hospital Stay (HOSPITAL_COMMUNITY)
Admission: AD | Admit: 2015-06-12 | Discharge: 2015-06-12 | Disposition: A | Discharge status: Home / Self Care | Payer: Medicaid Other | Source: Ambulatory Visit | Attending: Obstetrics and Gynecology | Admitting: Obstetrics and Gynecology

## 2015-06-12 ENCOUNTER — Encounter (HOSPITAL_COMMUNITY): Payer: Self-pay | Admitting: *Deleted

## 2015-06-12 ENCOUNTER — Inpatient Hospital Stay (HOSPITAL_COMMUNITY): Payer: Medicaid Other

## 2015-06-12 DIAGNOSIS — Z87891 Personal history of nicotine dependence: Secondary | ICD-10-CM | POA: Insufficient documentation

## 2015-06-12 DIAGNOSIS — R109 Unspecified abdominal pain: Secondary | ICD-10-CM | POA: Insufficient documentation

## 2015-06-12 DIAGNOSIS — Z3A27 27 weeks gestation of pregnancy: Secondary | ICD-10-CM | POA: Insufficient documentation

## 2015-06-12 DIAGNOSIS — O26892 Other specified pregnancy related conditions, second trimester: Secondary | ICD-10-CM | POA: Diagnosis not present

## 2015-06-12 LAB — URINALYSIS, ROUTINE W REFLEX MICROSCOPIC
BILIRUBIN URINE: NEGATIVE
GLUCOSE, UA: NEGATIVE mg/dL
Hgb urine dipstick: NEGATIVE
KETONES UR: NEGATIVE mg/dL
Leukocytes, UA: NEGATIVE
Nitrite: NEGATIVE
PH: 6 (ref 5.0–8.0)
Protein, ur: NEGATIVE mg/dL
Specific Gravity, Urine: 1.02 (ref 1.005–1.030)

## 2015-06-12 NOTE — MAU Provider Note (Signed)
Melinda Barnes is a 24 y.o. G3P2 at 27.2 weeks c/o groin and lower abd tightness.  It use to be off and on but hasn't gone away since yesterday.  Based on the location and the description, it sounds like round ligament pain.   History     Patient Active Problem List   Diagnosis Date Noted  . Bloody discharge from nipple 05/01/2015  . Trichomonas infection--dx 02/16/15, still present 04/21/15 05/01/2015  . 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 Migraine 03/31/2015  . 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 Vaginal Pressure    HPI  OB History    Gravida Para Term Preterm AB TAB SAB Ectopic Multiple Living   3 2 2       2       Past Medical History  Diagnosis Date  . Medical history non-contributory     Past Surgical History  Procedure Laterality Date  . No past surgeries      Family History  Problem Relation Age of Onset  . Asthma Mother     Social History  Substance Use Topics  . Smoking status: Former Smoker -- 1.00 packs/day for 2 years    Types: Cigarettes    Quit date: 05/22/2008  . Smokeless tobacco: Never Used  . Alcohol Use: No    Allergies:  Allergies  Allergen Reactions  . Latex Hives and Rash    Prescriptions prior to admission  Medication Sig Dispense Refill Last Dose  . Prenatal Vit-Fe Fumarate-FA (PRENATAL MULTIVITAMIN) TABS tablet Take 1 tablet by mouth daily at 12 noon.   Past Month at Unknown time  . cyclobenzaprine (FLEXERIL) 5 MG tablet Take 1 tablet (5 mg total) by mouth 3 (three) times daily as needed for muscle spasms. (Patient not taking: Reported on 05/01/2015) 30 tablet 1 Past Week at Unknown time  . ibuprofen (ADVIL,MOTRIN) 600 MG tablet Take 1 tablet (600 mg total) by mouth every 6 (six) hours as needed for headache. (Patient not taking: Reported on 05/01/2015) 30 tablet 2     ROS See HPI above, all other systems are negative  Physical Exam   Blood pressure 108/66, pulse 82, temperature 98.1 F (36.7  C), temperature source Oral, resp. rate 16, height 5\' 3"  (1.6 m), weight 146 lb (66.225 kg), last menstrual period 12/03/2014.  Physical Exam Ext:  WNL ABD: Soft, non tender to palpation, no rebound or guarding SVE: C/T/H posterior   ED Course  Assessment: IUP at  27.2weeks Membranes: FHR: Category 1 CTX:  none  Results for orders placed or performed during the hospital encounter of 06/12/15 (from the past 24 hour(s))  Urinalysis, Routine w reflex microscopic (not at [facility])     Status: None   Collection Time: 06/12/15  9:20 AM  Result Value Ref Range   Color, Urine YELLOW YELLOW   APPearance CLEAR CLEAR   Specific Gravity, Urine 1.020 1.005 - 1.030   pH 6.0 5.0 - 8.0   Glucose, UA NEGATIVE NEGATIVE mg/dL   Hgb urine dipstick NEGATIVE NEGATIVE   Bilirubin Urine NEGATIVE NEGATIVE   Ketones, ur NEGATIVE NEGATIVE mg/dL   Protein, ur NEGATIVE NEGATIVE mg/dL   Nitrite NEGATIVE NEGATIVE   Leukocytes, UA NEGATIVE NEGATIVE    [place]: FHR 148, fundal placenta, anatomy wnl, cervical length 3.75,  Plan: -Discussed need to follow up in office  -Bleeding and PTL Precautions -Encouraged to call if any questions or concerns arise prior to next scheduled office visit.  -Discharged  to home in stable condition -tylenol    Jezelle Gullick, CNM, MSN 06/12/2015. 10:31 AM

## 2015-06-12 NOTE — Discharge Instructions (Signed)
Preterm Labor Information  °Preterm labor is when labor starts at less than 37 weeks of pregnancy. The normal length of a pregnancy is 39 to 41 weeks.  °CAUSES  °Often, there is no identifiable underlying cause as to why a woman goes into preterm labor. One of the most common known causes of preterm labor is infection. Infections of the uterus, cervix, vagina, amniotic sac, bladder, kidney, or even the lungs (pneumonia) can cause labor to start. Other suspected causes of preterm labor include:  °Urogenital infections, such as yeast infections and bacterial vaginosis.  °Uterine abnormalities (uterine shape, uterine septum, fibroids, or bleeding from the placenta).  °A cervix that has been operated on (it may fail to stay closed).  °Malformations in the fetus.  °Multiple gestations (twins, triplets, and so on).  °Breakage of the amniotic sac.  °RISK FACTORS  °Having a previous history of preterm labor.  °Having premature rupture of membranes (PROM).  °Having a placenta that covers the opening of the cervix (placenta previa).  °Having a placenta that separates from the uterus (placental abruption).  °Having a cervix that is too weak to hold the fetus in the uterus (incompetent cervix).  °Having too much fluid in the amniotic sac (polyhydramnios).  °Taking illegal drugs or smoking while pregnant.  °Not gaining enough weight while pregnant.  °Being younger than 18 and older than 25 years old.  °Having a low socioeconomic status.  °Being African American. °SYMPTOMS  °Signs and symptoms of preterm labor include:  °Menstrual-like cramps, abdominal pain, or back pain.  °Uterine contractions that are regular, as frequent as six in an hour, regardless of their intensity (may be mild or painful).  °Contractions that start on the top of the uterus and spread down to the lower abdomen and back.  °A sense of increased pelvic pressure.  °A watery or bloody mucus discharge that comes from the vagina.  °TREATMENT  °Depending on the  length of the pregnancy and other circumstances, your health care provider may suggest bed rest. If necessary, there are medicines that can be given to stop contractions and to mature the fetal lungs. If labor happens before 34 weeks of pregnancy, a prolonged hospital stay may be recommended. Treatment depends on the condition of both you and the fetus.  °WHAT SHOULD YOU DO IF YOU THINK YOU ARE IN PRETERM LABOR?  °Call your health care provider right away. You will need to go to the hospital to get checked immediately.  °HOW CAN YOU PREVENT PRETERM LABOR IN FUTURE PREGNANCIES?  °You should:  °Stop smoking if you smoke.  °Maintain healthy weight gain and avoid chemicals and drugs that are not necessary.  °Be watchful for any type of infection.  °Inform your health care provider if you have a known history of preterm labor. ° °

## 2015-06-12 NOTE — MAU Note (Signed)
Pt presents to MAU with complaints of pelvic pressure since last night. Pt states she was recently treated for trich.

## 2015-06-26 NOTE — L&D Delivery Note (Signed)
Final Labor Progress Note At 1030 pt reports an increased in rectal pressure.  FHR remained reassuring with occasional variable decelerations.  Vaginal Delivery Note The pt utilized an epidural as pain management.   Spontaneous rupture of membranes today, at 0521, clear.  GBS was positive, PCN x 2 doses were given.  Cervical dilation was complete at 1035.  NICHD Category 2.    Pushing with guidance began at  1040.   After 48 minutes of pushing the head, shoulders and the body of a viable female infant "Serenity" delivered spontaneously with maternal effort in the ROP position at 1128.   With vigorous tone and spontaneous cry, the infant was placed on moms abd.  After the umbilical cord was clamped it was cut by the Partner, then cord blood was obtained for evaluation.  Spontaneous delivery of a intact placenta with a 3 vessel cord via [place] at 1139.   Episiotomy: None   The vulva, perineum, vaginal vault, rectum and cervix were inspected no repairs needed.  Postpartum pitocin as ordered.  Fundus firm, lochia minimum, bleeding under control.  EBL 50, Pt hemodynamically stable.   Sponge, laps and needle count correct and verified with the primary care nurse.  Attending MD available at all times.    Routine postpartum orders   Mother desires no contraception  Mom plans to breastfeed.   Placenta to pathology: NO     Cord Gases sent to lab: NO Cord blood sent to lab: YES   APGARS: 9 at 1 minute and 9 at 5 minutes Weight:. pending     Both mom and baby were left in stable condition, baby skin to skin.      Melinda Barnes, CNM, MSN 09/07/2015. 11:52 AM

## 2015-09-07 ENCOUNTER — Inpatient Hospital Stay (HOSPITAL_COMMUNITY): Payer: Medicaid Other | Admitting: Anesthesiology

## 2015-09-07 ENCOUNTER — Inpatient Hospital Stay (HOSPITAL_COMMUNITY)
Admission: AD | Admit: 2015-09-07 | Discharge: 2015-09-09 | DRG: 775 | Disposition: A | Discharge status: Home / Self Care | Payer: Medicaid Other | Source: Ambulatory Visit | Attending: Obstetrics and Gynecology | Admitting: Obstetrics and Gynecology

## 2015-09-07 ENCOUNTER — Encounter (HOSPITAL_COMMUNITY): Payer: Self-pay

## 2015-09-07 DIAGNOSIS — Z91040 Latex allergy status: Secondary | ICD-10-CM

## 2015-09-07 DIAGNOSIS — Z87891 Personal history of nicotine dependence: Secondary | ICD-10-CM | POA: Diagnosis not present

## 2015-09-07 DIAGNOSIS — O99824 Streptococcus B carrier state complicating childbirth: Secondary | ICD-10-CM | POA: Diagnosis present

## 2015-09-07 DIAGNOSIS — Z825 Family history of asthma and other chronic lower respiratory diseases: Secondary | ICD-10-CM | POA: Diagnosis not present

## 2015-09-07 DIAGNOSIS — O9081 Anemia of the puerperium: Secondary | ICD-10-CM | POA: Diagnosis not present

## 2015-09-07 DIAGNOSIS — R109 Unspecified abdominal pain: Secondary | ICD-10-CM | POA: Diagnosis present

## 2015-09-07 DIAGNOSIS — O4202 Full-term premature rupture of membranes, onset of labor within 24 hours of rupture: Secondary | ICD-10-CM | POA: Diagnosis present

## 2015-09-07 DIAGNOSIS — D62 Acute posthemorrhagic anemia: Secondary | ICD-10-CM | POA: Diagnosis not present

## 2015-09-07 DIAGNOSIS — Z3A39 39 weeks gestation of pregnancy: Secondary | ICD-10-CM | POA: Diagnosis not present

## 2015-09-07 DIAGNOSIS — O99019 Anemia complicating pregnancy, unspecified trimester: Secondary | ICD-10-CM

## 2015-09-07 LAB — RAPID HIV SCREEN (HIV 1/2 AB+AG)
HIV 1/2 Antibodies: NONREACTIVE
HIV-1 P24 Antigen - HIV24: NONREACTIVE

## 2015-09-07 LAB — CBC
HEMATOCRIT: 28.6 % — AB (ref 36.0–46.0)
Hemoglobin: 9.6 g/dL — ABNORMAL LOW (ref 12.0–15.0)
MCH: 27.9 pg (ref 26.0–34.0)
MCHC: 33.6 g/dL (ref 30.0–36.0)
MCV: 83.1 fL (ref 78.0–100.0)
Platelets: 251 10*3/uL (ref 150–400)
RBC: 3.44 MIL/uL — ABNORMAL LOW (ref 3.87–5.11)
RDW: 14.5 % (ref 11.5–15.5)
WBC: 9.8 10*3/uL (ref 4.0–10.5)

## 2015-09-07 LAB — RPR: RPR: NONREACTIVE

## 2015-09-07 LAB — TYPE AND SCREEN
ABO/RH(D): O POS
Antibody Screen: NEGATIVE

## 2015-09-07 MED ORDER — DIPHENHYDRAMINE HCL 50 MG/ML IJ SOLN: 12.5000 mg | INTRAMUSCULAR | Status: DC | PRN

## 2015-09-07 MED ORDER — EPHEDRINE 5 MG/ML INJ
10.0000 mg | INTRAVENOUS | Status: DC | PRN
  Filled 2015-09-07: qty 2

## 2015-09-07 MED ORDER — DEXTROSE 5 % IV SOLN
2.5000 10*6.[IU] | INTRAVENOUS | Status: DC
  Administered 2015-09-07: 2.5 10*6.[IU] via INTRAVENOUS
  Filled 2015-09-07 (×5): qty 2.5

## 2015-09-07 MED ORDER — OXYTOCIN 10 UNIT/ML IJ SOLN
2.5000 [IU]/h | INTRAVENOUS | Status: DC
  Administered 2015-09-07: 12:00:00 via INTRAVENOUS
  Filled 2015-09-07: qty 10

## 2015-09-07 MED ORDER — SIMETHICONE 80 MG PO CHEW: 80.0000 mg | CHEWABLE_TABLET | ORAL | Status: DC | PRN

## 2015-09-07 MED ORDER — WITCH HAZEL-GLYCERIN EX PADS
1.0000 "application " | MEDICATED_PAD | CUTANEOUS | Status: DC | PRN
  Administered 2015-09-07 – 2015-09-09 (×2): 1 via TOPICAL

## 2015-09-07 MED ORDER — ACETAMINOPHEN 325 MG PO TABS
650.0000 mg | ORAL_TABLET | ORAL | Status: DC | PRN
Start: 2015-09-07 — End: 2015-09-09
  Administered 2015-09-07: 650 mg via ORAL
  Filled 2015-09-07: qty 2

## 2015-09-07 MED ORDER — PHENYLEPHRINE 40 MCG/ML (10ML) SYRINGE FOR IV PUSH (FOR BLOOD PRESSURE SUPPORT)
80.0000 ug | PREFILLED_SYRINGE | INTRAVENOUS | Status: DC | PRN
  Filled 2015-09-07: qty 2

## 2015-09-07 MED ORDER — LACTATED RINGERS IV SOLN
500.0000 mL | INTRAVENOUS | Status: DC | PRN
  Administered 2015-09-07: 500 mL via INTRAVENOUS

## 2015-09-07 MED ORDER — BENZOCAINE-MENTHOL 20-0.5 % EX AERO
1.0000 "application " | INHALATION_SPRAY | CUTANEOUS | Status: DC | PRN
  Administered 2015-09-07: 1 via TOPICAL
  Filled 2015-09-07: qty 56

## 2015-09-07 MED ORDER — FENTANYL 2.5 MCG/ML BUPIVACAINE 1/10 % EPIDURAL INFUSION (WH - ANES)
14.0000 mL/h | INTRAMUSCULAR | Status: DC | PRN
  Administered 2015-09-07: 14 mL/h via EPIDURAL

## 2015-09-07 MED ORDER — ONDANSETRON HCL 4 MG/2ML IJ SOLN
4.0000 mg | Freq: Four times a day (QID) | INTRAMUSCULAR | Status: DC | PRN
  Administered 2015-09-07: 4 mg via INTRAVENOUS

## 2015-09-07 MED ORDER — CITRIC ACID-SODIUM CITRATE 334-500 MG/5ML PO SOLN: 30.0000 mL | ORAL | Status: DC | PRN

## 2015-09-07 MED ORDER — ONDANSETRON HCL 4 MG PO TABS: 4.0000 mg | ORAL_TABLET | ORAL | Status: DC | PRN

## 2015-09-07 MED ORDER — DIBUCAINE 1 % RE OINT
1.0000 "application " | TOPICAL_OINTMENT | RECTAL | Status: DC | PRN
  Administered 2015-09-09: 1 via RECTAL
  Filled 2015-09-07: qty 28

## 2015-09-07 MED ORDER — LACTATED RINGERS IV SOLN
500.0000 mL | Freq: Once | INTRAVENOUS | Status: AC
  Administered 2015-09-07: 500 mL via INTRAVENOUS

## 2015-09-07 MED ORDER — ACETAMINOPHEN 325 MG PO TABS: 650.0000 mg | ORAL_TABLET | ORAL | Status: DC | PRN

## 2015-09-07 MED ORDER — TETANUS-DIPHTH-ACELL PERTUSSIS 5-2.5-18.5 LF-MCG/0.5 IM SUSP: 0.5000 mL | Freq: Once | INTRAMUSCULAR | Status: DC

## 2015-09-07 MED ORDER — LIDOCAINE HCL (PF) 1 % IJ SOLN
INTRAMUSCULAR | Status: DC | PRN
Start: 2015-09-07 — End: 2015-09-07
  Administered 2015-09-07: 6 mL
  Administered 2015-09-07: 5 mL

## 2015-09-07 MED ORDER — OXYCODONE-ACETAMINOPHEN 5-325 MG PO TABS
1.0000 | ORAL_TABLET | ORAL | Status: DC | PRN
  Administered 2015-09-07 – 2015-09-09 (×3): 1 via ORAL
  Filled 2015-09-07 (×3): qty 1

## 2015-09-07 MED ORDER — ONDANSETRON HCL 4 MG/2ML IJ SOLN: 4.0000 mg | INTRAMUSCULAR | Status: DC | PRN

## 2015-09-07 MED ORDER — DIPHENHYDRAMINE HCL 25 MG PO CAPS: 25.0000 mg | ORAL_CAPSULE | Freq: Four times a day (QID) | ORAL | Status: DC | PRN

## 2015-09-07 MED ORDER — SENNOSIDES-DOCUSATE SODIUM 8.6-50 MG PO TABS
2.0000 | ORAL_TABLET | ORAL | Status: DC
  Administered 2015-09-08 (×2): 2 via ORAL
  Filled 2015-09-07 (×2): qty 2

## 2015-09-07 MED ORDER — OXYTOCIN BOLUS FROM INFUSION: 500.0000 mL | INTRAVENOUS | Status: DC

## 2015-09-07 MED ORDER — LACTATED RINGERS IV SOLN: INTRAVENOUS | Status: DC

## 2015-09-07 MED ORDER — IBUPROFEN 600 MG PO TABS
600.0000 mg | ORAL_TABLET | Freq: Four times a day (QID) | ORAL | Status: DC
  Administered 2015-09-07 – 2015-09-09 (×8): 600 mg via ORAL
  Filled 2015-09-07 (×8): qty 1

## 2015-09-07 MED ORDER — LACTATED RINGERS IV SOLN: 500.0000 mL | Freq: Once | INTRAVENOUS | Status: DC

## 2015-09-07 MED ORDER — PRENATAL MULTIVITAMIN CH
1.0000 | ORAL_TABLET | Freq: Every day | ORAL | Status: DC
  Administered 2015-09-08 – 2015-09-09 (×2): 1 via ORAL
  Filled 2015-09-07 (×2): qty 1

## 2015-09-07 MED ORDER — LIDOCAINE HCL (PF) 1 % IJ SOLN
30.0000 mL | INTRAMUSCULAR | Status: AC | PRN
  Administered 2015-09-07: 30 mL via SUBCUTANEOUS
  Filled 2015-09-07: qty 30

## 2015-09-07 MED ORDER — BUPIVACAINE HCL (PF) 0.25 % IJ SOLN
INTRAMUSCULAR | Status: DC | PRN
  Administered 2015-09-07: 8 mL via EPIDURAL

## 2015-09-07 MED ORDER — PENICILLIN G POTASSIUM 5000000 UNITS IJ SOLR
5.0000 10*6.[IU] | Freq: Once | INTRAVENOUS | Status: AC
  Administered 2015-09-07: 5 10*6.[IU] via INTRAVENOUS
  Filled 2015-09-07: qty 5

## 2015-09-07 MED ORDER — LANOLIN HYDROUS EX OINT: TOPICAL_OINTMENT | CUTANEOUS | Status: DC | PRN

## 2015-09-07 MED ORDER — FENTANYL CITRATE (PF) 100 MCG/2ML IJ SOLN
50.0000 ug | INTRAMUSCULAR | Status: DC | PRN
  Administered 2015-09-07: 50 ug via INTRAVENOUS

## 2015-09-07 MED ORDER — ZOLPIDEM TARTRATE 5 MG PO TABS: 5.0000 mg | ORAL_TABLET | Freq: Every evening | ORAL | Status: DC | PRN

## 2015-09-07 MED ORDER — LACTATED RINGERS IV SOLN
INTRAVENOUS | Status: DC
  Administered 2015-09-07 (×3): via INTRAVENOUS

## 2015-09-07 NOTE — Anesthesia Preprocedure Evaluation (Addendum)
Anesthesia Evaluation  Patient identified by MRN, date of birth, ID band Patient awake    Reviewed: Allergy & Precautions, NPO status , Patient's Chart, lab work & pertinent test results  Airway Mallampati: II  TM Distance: >3 FB Neck ROM: Full    Dental no notable dental hx.    Pulmonary former smoker,    Pulmonary exam normal breath sounds clear to auscultation       Cardiovascular negative cardio ROS Normal cardiovascular exam Rhythm:Regular Rate:Normal     Neuro/Psych  Headaches, negative psych ROS   GI/Hepatic negative GI ROS, Neg liver ROS,   Endo/Other  negative endocrine ROS  Renal/GU negative Renal ROS  negative genitourinary   Musculoskeletal negative musculoskeletal ROS (+)   Abdominal   Peds negative pediatric ROS (+)  Hematology negative hematology ROS (+)   Anesthesia Other Findings   Reproductive/Obstetrics negative OB ROS                             Anesthesia Physical Anesthesia Plan  ASA: II  Anesthesia Plan: Epidural   Post-op Pain Management:    Induction: Intravenous  Airway Management Planned:   Additional Equipment:   Intra-op Plan:   Post-operative Plan: Extubation in OR  Informed Consent: I have reviewed the patients History and Physical, chart, labs and discussed the procedure including the risks, benefits and alternatives for the proposed anesthesia with the patient or authorized representative who has indicated his/her understanding and acceptance.   Dental advisory given  Plan Discussed with: CRNA  Anesthesia Plan Comments: (Informed consent obtained prior to proceeding including risk of failure, 1% risk of PDPH, risk of minor discomfort and bruising.  Discussed rare but serious complications including epidural abscess, permanent nerve injury, epidural hematoma.  Discussed alternatives to epidural analgesia and patient desires to proceed.   Timeout performed pre-procedure verifying patient name, procedure, and platelet count.  Patient tolerated procedure well. )        Anesthesia Quick Evaluation

## 2015-09-07 NOTE — Lactation Note (Signed)
This note was copied from a baby's chart. Lactation Consultation Note Initial visit at 5 hours of age.  Mom reports a good feeding after delivery and plans to try when the baby gets a bath.  Mom is very sleepy and having a hard time keeping eyes open.  FOB at bedside with visitors taking pictures with baby.  Baby swaddled and should have bath in next hour.  Mom is aware of spoon feeding and offering STS for baby. [facility] LC resources given and discussed.  Encouraged to feed with early cues on demand.  Early newborn behavior discussed.  Hand expression reported by mom with colostrum visible.  Mom to call for assist as needed.    Patient Name: Melinda Barnes [id number] Date: 09/07/2015 Reason for consult: Initial assessment   Maternal Data Formula Feeding for Exclusion: No Has patient been taught Hand Expression?: Yes Does the patient have breastfeeding experience prior to this delivery?: Yes  Feeding    LATCH Score/Interventions                Intervention(s): Breastfeeding basics reviewed     Lactation Tools Discussed/Used     Consult Status Consult Status: Follow-up Date: 09/08/15 Follow-up type: In-patient    Shoptaw, Justine Null 09/07/2015, 5:11 PM

## 2015-09-07 NOTE — H&P (Signed)
Melinda Barnes is a 25 y.o. female, G3P2002 at 39.5 weeks, presenting for abdominal pain.  After evaluation patient found to be contracting frequently and VE reveals active labor.  Patient pregnancy history significant for positive GBS, trichomonas and chlamydial infections.  Patient personal history significant for fibroadenomas of breast and chronic headaches.  Patient desires and epidural.  Patient Active Problem List   Diagnosis Date Noted  . Bloody discharge from nipple 05/01/2015  . Trichomonas infection--dx 02/16/15, still present 04/21/15 05/01/2015  . 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 Migraine 03/31/2015  . 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   EDC of 09/09/2015 was established by Definite LMP of 12/03/2014.   Anatomy scan:  19.6 weeks, with normal findings and an posterior placenta.   Additional [place] evaluations:   None Significant prenatal events: 1st Trimester: Dx with trich at 10wks, however did not complete treatment. Evaluated for bloody nipple discharge, sent to breast cancer center.  2nd Trimester: Patient completed treatment for trich with negative TOC at 27wks.  Patient with phone calls c/o pelvic pressure and cold. Patient requests decease in work hours.  3rd Trimester:   Patient c/o cramping and vaginal pressure. Phone call reporting inability to walk-evaluated in office for muscle spasms and oow x 1wk. Patient reports lower back pain.  Urine specimen returns positive for CT. Reports desire for BTL Last evaluation:  08/29/2015 in office by Melinda Barnes, CNM.  FHR 145.  VE 0/60/-3, BP 118/70, Wt 165.5.  TWG: 47.5  OB History    Gravida Para Term Preterm AB TAB SAB Ectopic Multiple Living   3 2 2       2      04/2008-Female at 40wks.  SVD 5lbs 10oz 03/2010-Female at 40wks. SVD 6lbs 2oz  Past Medical History  Diagnosis Date  . Medical history non-contributory    Past Surgical History  Procedure Laterality Date  .  No past surgeries     Family History: family history includes Asthma in her mother. Social History:  reports that she quit smoking about 7 years ago. Her smoking use included Cigarettes. She has a 2 pack-year smoking history. She has never used smokeless tobacco. She reports that she does not drink alcohol or use illicit drugs.   Prenatal Transfer Tool  Maternal Diabetes: No Genetic Screening: Normal Maternal Ultrasounds/Referrals: Normal Fetal Ultrasounds or other Referrals:  None Maternal Substance Abuse:  No Significant Maternal Medications:  None Significant Maternal Lab Results: Lab values include: Group B Strep positive    ROS: +FM, -LoF, -VB, +Ctx Patient reports vomiting, constipation, and abdominal pain. Patient reports pain with contractions. Patient denies other issues.  Allergies  Allergen Reactions  . Latex Hives and Rash       Blood pressure 136/82, pulse 92, temperature 98.6 F (37 C), temperature source Oral, resp. rate 18, last menstrual period 12/03/2014.  Physical Exam  Vitals reviewed. Constitutional: She is oriented to person, place, and time. She appears well-developed and well-nourished. No distress.  HENT:  Head: Normocephalic and atraumatic.  Eyes: Conjunctivae are normal.  Neck: Normal range of motion.  Cardiovascular: Normal rate, regular rhythm and normal heart sounds.   Respiratory: Effort normal and breath sounds normal.  GI: Soft. Bowel sounds are normal.  Musculoskeletal: Normal range of motion. She exhibits no edema.  Neurological: She is alert and oriented to person, place, and time.  Skin: Skin is warm and dry.    [place]: EFW: 6 3/4lbs Presentation:  Vertex VE: 4-5/50/-3; Middle, Bloody Show, BBOW  FHR: 135 bpm, Mod Var, -Decels, +Accels UCs:  Q2-104min, palpates strong  Prenatal labs: ABO, Rh:  O Positive Antibody:  Negative Rubella:  Immune RPR:   NR HBsAg:  Negative  HIV:   NR GBS:  Positive Sickle cell/Hgb  electrophoresis:  Normal Pap:  Normal-02/2015 GC:  Negative Chlamydia:  Negative/Positive/Negative Other:  None    Assessment IUP at 39.5wks Cat I Ft Active Labor GBS Positive Latex Allergy  Plan: Admit to [facility]  Routine Labor and Delivery Orders per [facility] Protocol In room to complete assessment and discuss POC: Okay for epidural as desired Expectant mgmt at current Start PCN for GBS prophylaxis Melinda Barnes to be updated as appropriate  Melinda Quill, MSN 09/07/2015, 3:29 AM

## 2015-09-07 NOTE — Progress Notes (Signed)
Labor Progress  Subjective: C/o increased pain and pressure in her lower back/buttock.    Objective: BP 103/57 mmHg  Pulse 70  Temp(Src) 98.4 F (36.9 C) (Oral)  Resp 18  Ht 5\' 3"  (1.6 m)  Wt 153 lb (69.4 kg)  BMI 27.11 kg/m2  SpO2 98%  LMP 12/03/2014     FHT:135, moderate variability, + accel, early decels, occasional variable decel CTX:  regular, every 3-4nutes Uterus gravid, soft non tender SVE:  Dilation: 5.5 Effacement (%): 80 Station: -3 Exam by:: V. Cecila Satcher CNM  Assessment:  IUP at 39.5 NICHD: Category 2 Membranes:  SROM x 2.5hrs, no s/s of infection  Pain management:  IV pain management:  Epidural placement: at 0604 on 09/07/15  GBS positive  Abx:PCN x1  Plan: Continue labor plan Continuous monitoring Rest Frequent position changes to facilitate fetal rotation and descent. Will reassess with cervical exam at 1200 or earlier if necessary  Continue pitocin per protocol If variable continues give amnioinfusion with bolus of 300 cc, then 150 cc/hr.     Melinda Barnes, CNM, MSN 09/07/2015. 8:23 AM

## 2015-09-07 NOTE — MAU Note (Signed)
Pt presents via EMS for midline abdominal pain that is constant. Some braxton hicks contractions. Denies bleeding or leaking. Reports good fetal movement.

## 2015-09-07 NOTE — Progress Notes (Signed)
Delivery of live viable female by [employer], CNM. APGARS 9,9

## 2015-09-07 NOTE — Anesthesia Procedure Notes (Signed)
Epidural Patient location during procedure: OB  Staffing Anesthesiologist: Franne Grip Performed by: anesthesiologist   Preanesthetic Checklist Completed: patient identified, surgical consent, pre-op evaluation, timeout performed, IV checked, risks and benefits discussed and monitors and equipment checked  Epidural Patient position: sitting Prep: DuraPrep Patient monitoring: heart rate and blood pressure Approach: midline Location: L4-L5 Injection technique: LOR air  Needle:  Needle type: Tuohy  Needle gauge: 17 G Needle insertion depth: 5 cm Catheter type: closed end flexible Catheter size: 19 Gauge Catheter at skin depth: 10 cm Test dose: negative and Other  Assessment Events: blood not aspirated, injection not painful, no injection resistance, negative IV test and no paresthesia  Additional Notes Non-latex gloves used. Tolerated well.Reason for block:procedure for pain

## 2015-09-07 NOTE — Anesthesia Postprocedure Evaluation (Signed)
Anesthesia Post Note  Patient: Melinda Barnes  Procedure(s) Performed: * No procedures listed *  Patient location during evaluation: Mother Baby Anesthesia Type: Epidural Level of consciousness: awake and alert Pain management: pain level controlled Vital Signs Assessment: post-procedure vital signs reviewed and stable Respiratory status: spontaneous breathing, nonlabored ventilation and respiratory function stable Cardiovascular status: stable Postop Assessment: no headache, no backache and epidural receding Anesthetic complications: no    Last Vitals:  Filed Vitals:   09/07/15 1231 09/07/15 1246  BP: 104/77 111/66  Pulse: 103 82  Temp: 36.6 C   Resp: 16 18    Last Pain:  Filed Vitals:   09/07/15 1305  PainSc: 4                  Leatta Alewine

## 2015-09-07 NOTE — Progress Notes (Signed)
Education provided to patient on importance of empytying bladder vs vaginal bleeding. Attempted to perform I&O cath. During procedure, patient refused and said she would attempt to void at a later time.Marland Kitchen

## 2015-09-08 DIAGNOSIS — O99019 Anemia complicating pregnancy, unspecified trimester: Secondary | ICD-10-CM

## 2015-09-08 LAB — CBC
HCT: 22.6 % — ABNORMAL LOW (ref 36.0–46.0)
HEMOGLOBIN: 7.7 g/dL — AB (ref 12.0–15.0)
MCH: 28.4 pg (ref 26.0–34.0)
MCHC: 34.1 g/dL (ref 30.0–36.0)
MCV: 83.4 fL (ref 78.0–100.0)
PLATELETS: 179 10*3/uL (ref 150–400)
RBC: 2.71 MIL/uL — ABNORMAL LOW (ref 3.87–5.11)
RDW: 14.5 % (ref 11.5–15.5)
WBC: 12.2 10*3/uL — ABNORMAL HIGH (ref 4.0–10.5)

## 2015-09-08 MED ORDER — FERROUS SULFATE 325 (65 FE) MG PO TABS
325.0000 mg | ORAL_TABLET | Freq: Two times a day (BID) | ORAL | Status: DC
  Administered 2015-09-08 – 2015-09-09 (×2): 325 mg via ORAL
  Filled 2015-09-08 (×2): qty 1

## 2015-09-08 NOTE — Progress Notes (Signed)
Subjective: Postpartum Day 1 : Vaginal delivery, no lacerations Patient up ad lib, reports no syncope or dizziness. Feeding:  Bottle Contraceptive plan:  Undecided  Objective: Vital signs in last 24 hours: Temp:  [98.7 F (37.1 C)] 98.7 F (37.1 C) (03/16 0638) Pulse Rate:  [62-74] 62 (03/16 0638) Resp:  [18] 18 (03/16 0638) BP: (110-125)/(68-69) 110/69 mmHg (03/16 [id number])   Orthostatics stable.  Physical Exam:  General: alert Lochia: appropriate Uterine Fundus: firm Perineum: Clear DVT Evaluation: No evidence of DVT seen on physical exam. Negative Homan's sign.   CBC Latest Ref Rng 09/08/2015 09/07/2015 03/05/2013  WBC 4.0 - 10.5 K/uL 12.2(H) 9.8 7.6  Hemoglobin 12.0 - 15.0 g/dL 7.7(L) 9.6(L) 11.9(L)  Hematocrit 36.0 - 46.0 % 22.6(L) 28.6(L) 35.6(L)  Platelets 150 - 400 K/uL 179 251 240     Assessment/Plan: Status post vaginal delivery day 1. Anemia--chronic, and exacerbated by delivery blood loss Stable Continue current care. Plan for discharge tomorrow  Fe BID     Allena Katz 09/08/2015, 3:59 PM

## 2015-09-08 NOTE — Lactation Note (Signed)
This note was copied from a baby's chart. Lactation Consultation Note  Patient Name: Melinda Barnes [id number] Date: 09/08/2015 Reason for consult: Follow-up assessment Baby at 31 hr of life and mom reports feedings are going well. If baby does not latch, she expresses milk to feed by spoon or syring. Answered questions about milk handing and storage. She denies breast or nipple pain, voiced no concerns. Discussed baby behavior, feeding frequency, baby belly size, voids, wt loss, breast changes, and nipple care. She is aware of OP services and support group.    Maternal Data    Feeding Length of feed: 10 min  LATCH Score/Interventions Latch: Grasps breast easily, tongue down, lips flanged, rhythmical sucking.  Audible Swallowing: Spontaneous and intermittent  Type of Nipple: Everted at rest and after stimulation  Comfort (Breast/Nipple): Soft / non-tender     Hold (Positioning): No assistance needed to correctly position infant at breast.  LATCH Score: 10  Lactation Tools Discussed/Used     Consult Status Consult Status: Follow-up Date: 09/09/15 Follow-up type: In-patient    Denzil Hughes 09/08/2015, 6:38 PM

## 2015-09-09 MED ORDER — IBUPROFEN 600 MG PO TABS: 600.0000 mg | ORAL_TABLET | Freq: Four times a day (QID) | ORAL | Status: DC

## 2015-09-09 MED ORDER — SENNOSIDES-DOCUSATE SODIUM 8.6-50 MG PO TABS: 2.0000 | ORAL_TABLET | Freq: Every evening | ORAL | Status: DC | PRN

## 2015-09-09 MED ORDER — FERROUS SULFATE 325 (65 FE) MG PO TABS: 325.0000 mg | ORAL_TABLET | Freq: Two times a day (BID) | ORAL | Status: DC

## 2015-09-09 MED ORDER — MEDROXYPROGESTERONE ACETATE 150 MG/ML IM SUSP
150.0000 mg | Freq: Once | INTRAMUSCULAR | Status: AC
  Administered 2015-09-09: 150 mg via INTRAMUSCULAR
  Filled 2015-09-09: qty 1

## 2015-09-09 NOTE — Lactation Note (Signed)
This note was copied from a baby's chart. Lactation Consultation Note  Patient Name: Melinda Barnes Depas [id number] Date: 09/09/2015 Reason for consult: Follow-up assessment Visited with Mom on day of discharge, baby 52 hrs old.  Mom's breasts filling, and nurse had set up a double electric pump on Mom's request.  Baby has been latching well, with scores of 9 and 10.  Mom had just used DEBP and 45 ml transitional milk obtained.  Baby lying on bed, suckling on pacifier but woke up and started crying.  Recommended she latch baby now.  Mom latched baby easily, recommended that baby be skin to skin.  Talked about importance of frequent breast feedings, rather than a lot of pumping at this point.  Mom has manual pump to use prn, larger flange given.  Mom had requested a [facility] loaner pump, but is not signed up with [place].  Recommended she call [facility] and get an appointment prior to getting a loaner pump from [place].  Explained that [facility] saves their pumps for their NICU Moms and for Mom that babies aren't latching.  Mom to let her RN know if she gets an appointment with [place], and LC will give her a [facility] loaner.  Engorgement prevention and treatment discussed.  Reminded Mom of OP Lactation services available to her.  To call us prn.  Consult Status Consult Status: Complete Date: 09/09/15 Follow-up type: Call as needed    Broadus John 09/09/2015, 10:43 AM

## 2015-09-09 NOTE — Discharge Summary (Signed)
OB Discharge Summary  Patient Name: Melinda Barnes DOB: 1991-05-21 MRN: [id number]  Date of admission: 09/07/2015 Delivering MD: Sallee Provencal   Date of discharge: 09/09/2015  Admitting diagnosis: CTX Intrauterine pregnancy: [redacted]w[redacted]d     Secondary diagnosis:Principal Problem:   Normal vaginal delivery Active Problems:   Indication for care in labor or delivery   Anemia affecting pregnancy  Additional problems: Bloody discharge from nipple 05/01/2015   . Trichomonas infection--dx 02/16/15, still present 04/21/15 05/01/2015  . 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 Migraine 03/31/2015  . Fibroadenoma of breast             Discharge diagnosis: Term Pregnancy Delivered and Anemia                                                                     Post partum procedures:none  Augmentation: none  Complications: None  Hospital course:  Onset of Labor With Vaginal Delivery     25 y.o. yo G3P3001 at [redacted]w[redacted]d was admitted in Active Labor on 09/07/2015. Patient had an uncomplicated labor course as follows:  Membrane Rupture Time/Date: 5:21 AM ,09/07/2015   Intrapartum Procedures: Episiotomy: None [1]                                         Lacerations:  None [1]  Patient had a delivery of a Viable infant. 09/07/2015  Information for the patient's newborn:  Jer[place], Heavrin [id number]       Pateint had an uncomplicated postpartum course.  She is ambulating, tolerating a regular diet, passing flatus, and urinating well. Patient is discharged home in stable condition on 09/09/2015.    Physical exam  Filed Vitals:   09/07/15 1725 09/08/15 0638 09/08/15 1700 09/09/15 0605  BP: 125/68 110/69 126/76 140/81  Pulse: 74 62 70 59  Temp: 98.7 F (37.1 C) 98.7 F (37.1 C) 98.7 F (37.1 C) 97.8 F (36.6 C)  TempSrc: Oral Oral Oral   Resp: 18 18 18 20   Height:      Weight:      SpO2:       General: alert, cooperative and no distress Lochia: appropriate Uterine Fundus:  firm Incision: Healing well with no significant drainage DVT Evaluation: No evidence of DVT seen on physical exam. Labs: Lab Results  Component Value Date   WBC 12.2* 09/08/2015   HGB 7.7* 09/08/2015   HCT 22.6* 09/08/2015   MCV 83.4 09/08/2015   PLT 179 09/08/2015   CMP Latest Ref Rng 03/05/2013  Glucose 70 - 99 mg/dL 73  BUN 6 - 23 mg/dL 16  Creatinine 0.50 - 1.10 mg/dL 0.86  Sodium 135 - 145 mEq/L 138  Potassium 3.5 - 5.1 mEq/L 4.1  Chloride 96 - 112 mEq/L 105  CO2 19 - 32 mEq/L 27  Calcium 8.4 - 10.5 mg/dL 9.2    Discharge instruction: per After Visit Summary and "Baby and Me Booklet".  Medications:  Current facility-administered medications:  .  acetaminophen (TYLENOL) tablet 650 mg, 650 mg, Oral, Q4H PRN, Truc Winfree, CNM, 650 mg at 09/07/15 1933 .  benzocaine-Menthol (DERMOPLAST) 20-0.5 % topical spray 1 application, 1 application,  Topical, PRN, Aissa Lisowski, CNM, 1 application at [id number] .  witch hazel-glycerin (TUCKS) pad 1 application, 1 application, Topical, PRN, 1 application at [id number] **AND** dibucaine (NUPERCAINAL) 1 % rectal ointment 1 application, 1 application, Rectal, PRN, Graceland Wachter, CNM, 1 application at [id number] .  diphenhydrAMINE (BENADRYL) capsule 25 mg, 25 mg, Oral, Q6H PRN, Aldridge Krzyzanowski, CNM .  ferrous sulfate tablet 325 mg, 325 mg, Oral, BID WC, Donnel Saxon, CNM, 325 mg at 09/09/15 0953 .  ibuprofen (ADVIL,MOTRIN) tablet 600 mg, 600 mg, Oral, 4 times per day, Merion Grimaldo, CNM, 600 mg at 09/09/15 1243 .  lanolin ointment, , Topical, PRN, Lawyer Washabaugh, CNM .  ondansetron (ZOFRAN) tablet 4 mg, 4 mg, Oral, Q4H PRN **OR** ondansetron (ZOFRAN) injection 4 mg, 4 mg, Intravenous, Q4H PRN, Gladys Gutman, CNM .  oxyCODONE-acetaminophen (PERCOCET/ROXICET) 5-325 MG per tablet 1-2 tablet, 1-2 tablet, Oral, Q4H PRN, Lavetta Nielsen, CNM, 1 tablet at 09/09/15 0959 .  prenatal multivitamin tablet 1 tablet, 1 tablet, Oral, Q1200, Alija Riano, CNM, 1 tablet at 09/09/15 1243 .  senna-docusate (Senokot-S) tablet 2 tablet, 2 tablet, Oral, Q24H, Avenir Lozinski, CNM, 2 tablet at 09/08/15 2338 .  simethicone (MYLICON) chewable tablet 80 mg, 80 mg, Oral, PRN, Brenden Rudman, CNM .  Tdap (BOOSTRIX) injection 0.5 mL, 0.5 mL, Intramuscular, Once, Koleson Reifsteck, CNM .  zolpidem (AMBIEN) tablet 5 mg, 5 mg, Oral, QHS PRN, Gianny Killman, CNM After Visit Meds:    Medication List    TAKE these medications        ferrous sulfate 325 (65 FE) MG tablet  Take 1 tablet (325 mg total) by mouth 2 (two) times daily with a meal.     ibuprofen 600 MG tablet  Commonly known as:  ADVIL,MOTRIN  Take 1 tablet (600 mg total) by mouth every 6 (six) hours.     prenatal multivitamin Tabs tablet  Take 1 tablet by mouth daily at 12 noon.     senna-docusate 8.6-50 MG tablet  Commonly known as:  Senokot-S  Take 2 tablets by mouth at bedtime as needed for mild constipation.        Diet: routine diet  Activity: Advance as tolerated. Pelvic rest for 6 weeks.   Outpatient follow up:6 weeks Follow up Appt:No future appointments. Follow up visit: No Follow-up on file.  Postpartum contraception: Depo Provera  Newborn Data: Live born female  Birth Weight: 7 lb 12 oz (3515 g) APGAR: 9, 9  Baby Feeding: Bottle and Breast Disposition:home with mother   09/09/2015 Leveta Wahab, CNM      Postpartum Care After Vaginal Delivery  After you deliver your newborn (postpartum period), the usual stay in the hospital is 24 72 hours. If there were problems with your labor or delivery, or if you have other medical problems, you might be in the hospital longer.  While you are in the hospital, you will receive help and instructions on how to care for yourself and your newborn during the postpartum period.  While you are in the hospital:  Be sure to tell your nurses if you have pain or discomfort, as well as where you feel the pain and what makes  the pain worse.  If you had an incision made near your vagina (episiotomy) or if you had some tearing during delivery, the nurses may put ice packs on your episiotomy or tear. The ice packs may help to reduce the pain and swelling.  If you are breastfeeding, you may feel  uncomfortable contractions of your uterus for a couple of weeks. This is normal. The contractions help your uterus get back to normal size.  It is normal to have some bleeding after delivery.  For the first 1 3 days after delivery, the flow is red and the amount may be similar to a period.  It is common for the flow to start and stop.  In the first few days, you may pass some small clots. Let your nurses know if you begin to pass large clots or your flow increases.  Do not  flush blood clots down the toilet before having the nurse look at them.  During the next 3 10 days after delivery, your flow should become more watery and pink or brown-tinged in color.  Ten to fourteen days after delivery, your flow should be a small amount of yellowish-white discharge.  The amount of your flow will decrease over the first few weeks after delivery. Your flow may stop in 6 8 weeks. Most women have had their flow stop by 12 weeks after delivery.  You should change your sanitary pads frequently.  Wash your hands thoroughly with soap and water for at least 20 seconds after changing pads, using the toilet, or before holding or feeding your newborn.  You should feel like you need to empty your bladder within the first 6 8 hours after delivery.  In case you become weak, lightheaded, or faint, call your nurse before you get out of bed for the first time and before you take a shower for the first time.  Within the first few days after delivery, your breasts may begin to feel tender and full. This is called engorgement. Breast tenderness usually goes away within 48 72 hours after engorgement occurs. You may also notice milk leaking from your  breasts. If you are not breastfeeding, do not stimulate your breasts. Breast stimulation can make your breasts produce more milk.  Spending as much time as possible with your newborn is very important. During this time, you and your newborn can feel close and get to know each other. Having your newborn stay in your room (rooming in) will help to strengthen the bond with your newborn. It will give you time to get to know your newborn and become comfortable caring for your newborn.  Your hormones change after delivery. Sometimes the hormone changes can temporarily cause you to feel sad or tearful. These feelings should not last more than a few days. If these feelings last longer than that, you should talk to your caregiver.  If desired, talk to your caregiver about methods of family planning or contraception.  Talk to your caregiver about immunizations. Your caregiver may want you to have the following immunizations before leaving the hospital:  Tetanus, diphtheria, and pertussis (Tdap) or tetanus and diphtheria (Td) immunization. It is very important that you and your family (including grandparents) or others caring for your newborn are up-to-date with the Tdap or Td immunizations. The Tdap or Td immunization can help protect your newborn from getting ill.  Rubella immunization.  Varicella (chickenpox) immunization.  Influenza immunization. You should receive this annual immunization if you did not receive the immunization during your pregnancy. Document Released: 04/08/2007 Document Revised: 03/05/2012 Document Reviewed: 02/06/2012 [facility] Patient Information 2014 [place].   Postpartum Depression and Baby Blues  The postpartum period begins right after the birth of a baby. During this time, there is often a great amount of joy and excitement. It is also a  time of considerable changes in the life of the parent(s). Regardless of how many times a mother gives birth, each child brings  new challenges and dynamics to the family. It is not unusual to have feelings of excitement accompanied by confusing shifts in moods, emotions, and thoughts. All mothers are at risk of developing postpartum depression or the "baby blues." These mood changes can occur right after giving birth, or they may occur many months after giving birth. The baby blues or postpartum depression can be mild or severe. Additionally, postpartum depression can resolve rather quickly, or it can be a long-term condition. CAUSES Elevated hormones and their rapid decline are thought to be a main cause of postpartum depression and the baby blues. There are a number of hormones that radically change during and after pregnancy. Estrogen and progesterone usually decrease immediately after delivering your baby. The level of thyroid hormone and various cortisol steroids also rapidly drop. Other factors that play a major role in these changes include major life events and genetics.  RISK FACTORS If you have any of the following risks for the baby blues or postpartum depression, know what symptoms to watch out for during the postpartum period. Risk factors that may increase the likelihood of getting the baby blues or postpartum depression include: 1. Havinga personal or family history of depression. 2. Having depression while being pregnant. 3. Having premenstrual or oral contraceptive-associated mood issues. 4. Having exceptional life stress. 5. Having marital conflict. 6. Lacking a social support network. 7. Having a baby with special needs. 8. Having health problems such as diabetes. SYMPTOMS Baby blues symptoms include:  Brief fluctuations in mood, such as going from extreme happiness to sadness.  Decreased concentration.  Difficulty sleeping.  Crying spells, tearfulness.  Irritability.  Anxiety. Postpartum depression symptoms typically begin within the first month after giving birth. These symptoms  include:  Difficulty sleeping or excessive sleepiness.  Marked weight loss.  Agitation.  Feelings of worthlessness.  Lack of interest in activity or food. Postpartum psychosis is a very concerning condition and can be dangerous. Fortunately, it is rare. Displaying any of the following symptoms is cause for immediate medical attention. Postpartum psychosis symptoms include:  Hallucinations and delusions.  Bizarre or disorganized behavior.  Confusion or disorientation. DIAGNOSIS  A diagnosis is made by an evaluation of your symptoms. There are no medical or lab tests that lead to a diagnosis, but there are various questionnaires that a caregiver may use to identify those with the baby blues, postpartum depression, or psychosis. Often times, a screening tool called the [place] Postnatal Depression Scale is used to diagnose depression in the postpartum period.  TREATMENT The baby blues usually goes away on its own in 1 to 2 weeks. Social support is often all that is needed. You should be encouraged to get adequate sleep and rest. Occasionally, you may be given medicines to help you sleep.  Postpartum depression requires treatment as it can last several months or longer if it is not treated. Treatment may include individual or group therapy, medicine, or both to address any social, physiological, and psychological factors that may play a role in the depression. Regular exercise, a healthy diet, rest, and social support may also be strongly recommended.  Postpartum psychosis is more serious and needs treatment right away. Hospitalization is often needed. HOME CARE INSTRUCTIONS  Get as much rest as you can. Nap when the baby sleeps.  Exercise regularly. Some women find yoga and walking to be beneficial.  Eat  a balanced and nourishing diet.  Do little things that you enjoy. Have a cup of tea, take a bubble bath, read your favorite magazine, or listen to your favorite music.  Avoid  alcohol.  Ask for help with household chores, cooking, grocery shopping, or running errands as needed. Do not try to do everything.  Talk to people close to you about how you are feeling. Get support from your partner, family members, friends, or other new moms.  Try to stay positive in how you think. Think about the things you are grateful for.  Do not spend a lot of time alone.  Only take medicine as directed by your caregiver.  Keep all your postpartum appointments.  Let your caregiver know if you have any concerns. SEEK MEDICAL CARE IF: You are having a reaction or problems with your medicine. SEEK IMMEDIATE MEDICAL CARE IF:  You have suicidal feelings.  You feel you may harm the baby or someone else. Document Released: 03/15/2004 Document Revised: 09/03/2011 Document Reviewed: 04/17/2011 [facility] Patient Information 2014 [place], [place].     Breastfeeding Deciding to breastfeed is one of the best choices you can make for you and your baby. A change in hormones during pregnancy causes your breast tissue to grow and increases the number and size of your milk ducts. These hormones also allow proteins, sugars, and fats from your blood supply to make breast milk in your milk-producing glands. Hormones prevent breast milk from being released before your baby is born as well as prompt milk flow after birth. Once breastfeeding has begun, thoughts of your baby, as well as his or her sucking or crying, can stimulate the release of milk from your milk-producing glands.  BENEFITS OF BREASTFEEDING For Your Baby  Your first milk (colostrum) helps your baby's digestive system function better.   There are antibodies in your milk that help your baby fight off infections.   Your baby has a lower incidence of asthma, allergies, and sudden infant death syndrome.   The nutrients in breast milk are better for your baby than infant formulas and are designed uniquely for your baby's needs.    Breast milk improves your baby's brain development.   Your baby is less likely to develop other conditions, such as childhood obesity, asthma, or type 2 diabetes mellitus.  For You   Breastfeeding helps to create a very special bond between you and your baby.   Breastfeeding is convenient. Breast milk is always available at the correct temperature and costs nothing.   Breastfeeding helps to burn calories and helps you lose the weight gained during pregnancy.   Breastfeeding makes your uterus contract to its prepregnancy size faster and slows bleeding (lochia) after you give birth.   Breastfeeding helps to lower your risk of developing type 2 diabetes mellitus, osteoporosis, and breast or ovarian cancer later in life. SIGNS THAT YOUR BABY IS HUNGRY Early Signs of Hunger  Increased alertness or activity.  Stretching.  Movement of the head from side to side.  Movement of the head and opening of the mouth when the corner of the mouth or cheek is stroked (rooting).  Increased sucking sounds, smacking lips, cooing, sighing, or squeaking.  Hand-to-mouth movements.  Increased sucking of fingers or hands. Late Signs of Hunger  Fussing.  Intermittent crying. Extreme Signs of Hunger Signs of extreme hunger will require calming and consoling before your baby will be able to breastfeed successfully. Do not wait for the following signs of extreme hunger to occur  before you initiate breastfeeding:   Restlessness.  A loud, strong cry.   Screaming.   BREASTFEEDING BASICS Breastfeeding Initiation  Find a comfortable place to sit or lie down, with your neck and back well supported.  Place a pillow or rolled up blanket under your baby to bring him or her to the level of your breast (if you are seated). Nursing pillows are specially designed to help support your arms and your baby while you breastfeed.  Make sure that your baby's abdomen is facing your abdomen.   Gently  massage your breast. With your fingertips, massage from your chest wall toward your nipple in a circular motion. This encourages milk flow. You may need to continue this action during the feeding if your milk flows slowly.  Support your breast with 4 fingers underneath and your thumb above your nipple. Make sure your fingers are well away from your nipple and your baby's mouth.   Stroke your baby's lips gently with your finger or nipple.   When your baby's mouth is open wide enough, quickly bring your baby to your breast, placing your entire nipple and as much of the colored area around your nipple (areola) as possible into your baby's mouth.   More areola should be visible above your baby's upper lip than below the lower lip.   Your baby's tongue should be between his or her lower gum and your breast.   Ensure that your baby's mouth is correctly positioned around your nipple (latched). Your baby's lips should create a seal on your breast and be turned out (everted).  It is common for your baby to suck about 2-3 minutes in order to start the flow of breast milk. Latching Teaching your baby how to latch on to your breast properly is very important. An improper latch can cause nipple pain and decreased milk supply for you and poor weight gain in your baby. Also, if your baby is not latched onto your nipple properly, he or she may swallow some air during feeding. This can make your baby fussy. Burping your baby when you switch breasts during the feeding can help to get rid of the air. However, teaching your baby to latch on properly is still the best way to prevent fussiness from swallowing air while breastfeeding. Signs that your baby has successfully latched on to your nipple:    Silent tugging or silent sucking, without causing you pain.   Swallowing heard between every 3-4 sucks.    Muscle movement above and in front of his or her ears while sucking.  Signs that your baby has not  successfully latched on to nipple:   Sucking sounds or smacking sounds from your baby while breastfeeding.  Nipple pain. If you think your baby has not latched on correctly, slip your finger into the corner of your baby's mouth to break the suction and place it between your baby's gums. Attempt breastfeeding initiation again. Signs of Successful Breastfeeding Signs from your baby:   A gradual decrease in the number of sucks or complete cessation of sucking.   Falling asleep.   Relaxation of his or her body.   Retention of a small amount of milk in his or her mouth.   Letting go of your breast by himself or herself. Signs from you:  Breasts that have increased in firmness, weight, and size 1-3 hours after feeding.   Breasts that are softer immediately after breastfeeding.  Increased milk volume, as well as a change in milk  consistency and color by the fifth day of breastfeeding.   Nipples that are not sore, cracked, or bleeding. Signs That Your Randel Books is Getting Enough Milk  Wetting at least 3 diapers in a 24-hour period. The urine should be clear and pale yellow by age 390 days.  At least 3 stools in a 24-hour period by age 390 days. The stool should be soft and yellow.  At least 3 stools in a 24-hour period by age 57 days. The stool should be seedy and yellow.  No loss of weight greater than 10% of birth weight during the first 48 days of age.  Average weight gain of 4-7 ounces (113-198 g) per week after age 58 days.  Consistent daily weight gain by age 9 days, without weight loss after the age of 2 weeks. After a feeding, your baby may spit up a small amount. This is common. BREASTFEEDING FREQUENCY AND DURATION Frequent feeding will help you make more milk and can prevent sore nipples and breast engorgement. Breastfeed when you feel the need to reduce the fullness of your breasts or when your baby shows signs of hunger. This is called "breastfeeding on demand." Avoid  introducing a pacifier to your baby while you are working to establish breastfeeding (the first 4-6 weeks after your baby is born). After this time you may choose to use a pacifier. Research has shown that pacifier use during the first year of a baby's life decreases the risk of sudden infant death syndrome (SIDS). Allow your baby to feed on each breast as long as he or she wants. Breastfeed until your baby is finished feeding. When your baby unlatches or falls asleep while feeding from the first breast, offer the second breast. Because newborns are often sleepy in the first few weeks of life, you may need to awaken your baby to get him or her to feed. Breastfeeding times will vary from baby to baby. However, the following rules can serve as a guide to help you ensure that your baby is properly fed:  Newborns (babies 67 weeks of age or younger) may breastfeed every 1-3 hours.  Newborns should not go longer than 3 hours during the day or 5 hours during the night without breastfeeding.  You should breastfeed your baby a minimum of 8 times in a 24-hour period until you begin to introduce solid foods to your baby at around 62 months of age. BREAST MILK PUMPING Pumping and storing breast milk allows you to ensure that your baby is exclusively fed your breast milk, even at times when you are unable to breastfeed. This is especially important if you are going back to work while you are still breastfeeding or when you are not able to be present during feedings. Your lactation consultant can give you guidelines on how long it is safe to store breast milk.  A breast pump is a machine that allows you to pump milk from your breast into a sterile bottle. The pumped breast milk can then be stored in a refrigerator or freezer. Some breast pumps are operated by hand, while others use electricity. Ask your lactation consultant which type will work best for you. Breast pumps can be purchased, but some hospitals and  breastfeeding support groups lease breast pumps on a monthly basis. A lactation consultant can teach you how to hand express breast milk, if you prefer not to use a pump.  CARING FOR YOUR BREASTS WHILE YOU BREASTFEED Nipples can become dry, cracked, and sore while  breastfeeding. The following recommendations can help keep your breasts moisturized and healthy:  Avoid using soap on your nipples.   Wear a supportive bra. Although not required, special nursing bras and tank tops are designed to allow access to your breasts for breastfeeding without taking off your entire bra or top. Avoid wearing underwire-style bras or extremely tight bras.  Air dry your nipples for 3-28minutes after each feeding.   Use only cotton bra pads to absorb leaked breast milk. Leaking of breast milk between feedings is normal.   Use lanolin on your nipples after breastfeeding. Lanolin helps to maintain your skin's normal moisture barrier. If you use pure lanolin, you do not need to wash it off before feeding your baby again. Pure lanolin is not toxic to your baby. You may also hand express a few drops of breast milk and gently massage that milk into your nipples and allow the milk to air dry. In the first few weeks after giving birth, some women experience extremely full breasts (engorgement). Engorgement can make your breasts feel heavy, warm, and tender to the touch. Engorgement peaks within 3-5 days after you give birth. The following recommendations can help ease engorgement:  Completely empty your breasts while breastfeeding or pumping. You may want to start by applying warm, moist heat (in the shower or with warm water-soaked hand towels) just before feeding or pumping. This increases circulation and helps the milk flow. If your baby does not completely empty your breasts while breastfeeding, pump any extra milk after he or she is finished.  Wear a snug bra (nursing or regular) or tank top for 1-2 days to signal your  body to slightly decrease milk production.  Apply ice packs to your breasts, unless this is too uncomfortable for you.  Make sure that your baby is latched on and positioned properly while breastfeeding. If engorgement persists after 48 hours of following these recommendations, contact your health care provider or a Science writer. OVERALL HEALTH CARE RECOMMENDATIONS WHILE BREASTFEEDING  Eat healthy foods. Alternate between meals and snacks, eating 3 of each per day. Because what you eat affects your breast milk, some of the foods may make your baby more irritable than usual. Avoid eating these foods if you are sure that they are negatively affecting your baby.  Drink milk, fruit juice, and water to satisfy your thirst (about 10 glasses a day).   Rest often, relax, and continue to take your prenatal vitamins to prevent fatigue, stress, and anemia.  Continue breast self-awareness checks.  Avoid chewing and smoking tobacco.  Avoid alcohol and drug use. Some medicines that may be harmful to your baby can pass through breast milk. It is important to ask your health care provider before taking any medicine, including all over-the-counter and prescription medicine as well as vitamin and herbal supplements. It is possible to become pregnant while breastfeeding. If birth control is desired, ask your health care provider about options that will be safe for your baby. SEEK MEDICAL CARE IF:   You feel like you want to stop breastfeeding or have become frustrated with breastfeeding.  You have painful breasts or nipples.  Your nipples are cracked or bleeding.  Your breasts are red, tender, or warm.  You have a swollen area on either breast.  You have a fever or chills.  You have nausea or vomiting.  You have drainage other than breast milk from your nipples.  Your breasts do not become full before feedings by the fifth day after  you give birth.  You feel sad and depressed.  Your  baby is too sleepy to eat well.  Your baby is having trouble sleeping.   Your baby is wetting less than 3 diapers in a 24-hour period.  Your baby has less than 3 stools in a 24-hour period.  Your baby's skin or the white part of his or her eyes becomes yellow.   Your baby is not gaining weight by 58 days of age. SEEK IMMEDIATE MEDICAL CARE IF:   Your baby is overly tired (lethargic) and does not want to wake up and feed.  Your baby develops an unexplained fever. Document Released: 06/11/2005 Document Revised: 06/16/2013 Document Reviewed: 12/03/2012 [facility] Patient Information 2015 [place], [place]. This information is not intended to replace advice given to you by your health care provider. Make sure you discuss any questions you have with your health care provider.

## 2016-05-29 ENCOUNTER — Other Ambulatory Visit: Payer: Self-pay | Admitting: Obstetrics and Gynecology

## 2016-05-31 ENCOUNTER — Other Ambulatory Visit: Payer: Self-pay | Admitting: Obstetrics and Gynecology

## 2016-05-31 DIAGNOSIS — E049 Nontoxic goiter, unspecified: Secondary | ICD-10-CM

## 2016-06-06 ENCOUNTER — Other Ambulatory Visit: Payer: Medicaid Other

## 2016-09-28 ENCOUNTER — Encounter (HOSPITAL_COMMUNITY): Payer: Self-pay | Admitting: *Deleted

## 2016-09-28 ENCOUNTER — Emergency Department (HOSPITAL_COMMUNITY)
Admission: EM | Admit: 2016-09-28 | Discharge: 2016-09-29 | Disposition: A | Discharge status: Home / Self Care | Payer: Medicaid Other | Attending: Emergency Medicine | Admitting: Emergency Medicine

## 2016-09-28 DIAGNOSIS — Z87891 Personal history of nicotine dependence: Secondary | ICD-10-CM | POA: Diagnosis not present

## 2016-09-28 DIAGNOSIS — Z853 Personal history of malignant neoplasm of breast: Secondary | ICD-10-CM | POA: Diagnosis not present

## 2016-09-28 DIAGNOSIS — G8929 Other chronic pain: Secondary | ICD-10-CM | POA: Diagnosis not present

## 2016-09-28 DIAGNOSIS — M545 Low back pain: Secondary | ICD-10-CM | POA: Diagnosis not present

## 2016-09-28 DIAGNOSIS — Z91040 Latex allergy status: Secondary | ICD-10-CM | POA: Diagnosis not present

## 2016-09-28 LAB — URINALYSIS, MICROSCOPIC (REFLEX)

## 2016-09-28 LAB — CBC
HEMATOCRIT: 37.8 % (ref 36.0–46.0)
HEMOGLOBIN: 12.3 g/dL (ref 12.0–15.0)
MCH: 27.3 pg (ref 26.0–34.0)
MCHC: 32.5 g/dL (ref 30.0–36.0)
MCV: 84 fL (ref 78.0–100.0)
Platelets: 236 10*3/uL (ref 150–400)
RBC: 4.5 MIL/uL (ref 3.87–5.11)
RDW: 14.3 % (ref 11.5–15.5)
WBC: 6.2 10*3/uL (ref 4.0–10.5)

## 2016-09-28 LAB — COMPREHENSIVE METABOLIC PANEL
ALBUMIN: 3.5 g/dL (ref 3.5–5.0)
ALT: 23 U/L (ref 14–54)
ANION GAP: 7 (ref 5–15)
AST: 16 U/L (ref 15–41)
Alkaline Phosphatase: 62 U/L (ref 38–126)
BILIRUBIN TOTAL: 0.7 mg/dL (ref 0.3–1.2)
BUN: 14 mg/dL (ref 6–20)
CHLORIDE: 107 mmol/L (ref 101–111)
CO2: 27 mmol/L (ref 22–32)
Calcium: 9 mg/dL (ref 8.9–10.3)
Creatinine, Ser: 0.75 mg/dL (ref 0.44–1.00)
GFR calc Af Amer: >60 mL/min (ref >60)
GFR calc non Af Amer: >60 mL/min (ref >60)
Glucose, Bld: 88 mg/dL (ref 65–99)
POTASSIUM: 3.4 mmol/L — AB (ref 3.5–5.1)
SODIUM: 141 mmol/L (ref 135–145)
TOTAL PROTEIN: 6.6 g/dL (ref 6.5–8.1)

## 2016-09-28 LAB — URINALYSIS, ROUTINE W REFLEX MICROSCOPIC
Bilirubin Urine: NEGATIVE
GLUCOSE, UA: NEGATIVE mg/dL
Ketones, ur: NEGATIVE mg/dL
NITRITE: NEGATIVE
PH: 8 (ref 5.0–8.0)
Protein, ur: 100 mg/dL — AB
SPECIFIC GRAVITY, URINE: 1.02 (ref 1.005–1.030)

## 2016-09-28 LAB — POC URINE PREG, ED: Preg Test, Ur: NEGATIVE

## 2016-09-28 MED ORDER — ONDANSETRON HCL 4 MG/2ML IJ SOLN
4.0000 mg | Freq: Once | INTRAMUSCULAR | Status: AC
  Administered 2016-09-28: 4 mg via INTRAVENOUS
  Filled 2016-09-28: qty 2

## 2016-09-28 MED ORDER — ONDANSETRON HCL 4 MG PO TABS: 4.0000 mg | ORAL_TABLET | Freq: Three times a day (TID) | ORAL | 0 refills | Status: AC | PRN

## 2016-09-28 MED ORDER — CYCLOBENZAPRINE HCL 10 MG PO TABS: 5.0000 mg | ORAL_TABLET | Freq: Two times a day (BID) | ORAL | 0 refills | Status: AC | PRN

## 2016-09-28 NOTE — ED Triage Notes (Signed)
The pt is c/o flank pain a headache and is not asnwering questions asked  lmp none iud

## 2016-09-28 NOTE — ED Notes (Signed)
Pt stable, ambulatory, states understanding of discharge instructions 

## 2016-09-28 NOTE — ED Provider Notes (Signed)
[facility] DEPT Provider Note   CSN: [id number] Arrival date & time: 09/28/16  2056     History   Chief Complaint Chief Complaint  Patient presents with  . Flank Pain    HPI Melinda Barnes is a 26 y.o. female.  The history is provided by the patient.  GI Problem  This is a new problem. The current episode started 3 to 5 hours ago. The problem occurs constantly. The problem has not changed since onset.Pertinent negatives include no chest pain, no abdominal pain, no headaches and no shortness of breath. Associated symptoms comments: Chronic right lower back pain. Exacerbated by: Symptoms all started after applying an OTC pain patch to her back this afternoon. Nothing relieves the symptoms. She has tried nothing for the symptoms.    Past Medical History:  Diagnosis Date  . Medical history non-contributory     Patient Active Problem List   Diagnosis Date Noted  . Anemia affecting pregnancy 09/08/2015  . Indication for care in labor or delivery 09/07/2015  . Normal vaginal delivery 09/07/2015  . Bloody discharge from nipple 05/01/2015  . Trichomonas infection--dx 02/16/15, still present 04/21/15 05/01/2015  . 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 Migraine 03/31/2015  . 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 NO PAST SURGERIES      OB History    Gravida Para Term Preterm AB Living   3 3 3     1    SAB TAB Ectopic Multiple Live Births         0 1       Home Medications    Prior to Admission medications   Medication Sig Start Date End Date Taking? Authorizing Provider  ferrous sulfate 325 (65 FE) MG tablet Take 1 tablet (325 mg total) by mouth 2 (two) times daily with a meal. 09/09/15   Venus Standard, CNM  ibuprofen (ADVIL,MOTRIN) 600 MG tablet Take 1 tablet (600 mg total) by mouth every 6 (six) hours. 09/09/15   Venus Standard, CNM  Prenatal Vit-Fe Fumarate-FA (PRENATAL MULTIVITAMIN) TABS tablet Take 1 tablet by mouth daily at  12 noon.    Historical Provider, MD  senna-docusate (SENOKOT-S) 8.6-50 MG tablet Take 2 tablets by mouth at bedtime as needed for mild constipation. 09/09/15   Venus Standard, CNM    Family History Family History  Problem Relation Age of Onset  . Asthma Mother     Social History Social History  Substance Use Topics  . Smoking status: Former Smoker    Packs/day: 1.00    Years: 2.00    Types: Cigarettes    Quit date: 05/22/2008  . Smokeless tobacco: Never Used  . Alcohol use No     Allergies   Latex   Review of Systems Review of Systems  Constitutional: Negative for chills and fever.  HENT: Negative for ear pain and sore throat.   Eyes: Negative for pain and visual disturbance.  Respiratory: Negative for cough and shortness of breath.   Cardiovascular: Negative for chest pain and palpitations.  Gastrointestinal: Positive for nausea and vomiting. Negative for abdominal pain.  Genitourinary: Negative for dysuria, flank pain and hematuria.  Musculoskeletal: Positive for back pain (chronic). Negative for arthralgias.  Skin: Negative for color change and rash.  Neurological: Negative for seizures, syncope and headaches.  All other systems reviewed and are negative.   Physical Exam Updated Vital Signs BP 119/83 (BP Location: Right Arm)   Pulse (!) 49   Temp 98.3  F (36.8 C) (Oral)   Resp (!) 22   SpO2 99%   Physical Exam  Constitutional: She is oriented to person, place, and time. She appears well-developed and well-nourished. She appears distressed.  Pt actively vomiting during interview & exam  HENT:  Head: Normocephalic and atraumatic.  Eyes: Conjunctivae are normal.  Neck: Neck supple.  Cardiovascular: Regular rhythm.   No murmur heard. Bradycardic  Pulmonary/Chest: Effort normal and breath sounds normal. No respiratory distress.  Abdominal: Soft. There is no tenderness.  Musculoskeletal: She exhibits no edema.  No CVAT  Neurological: She is alert and  oriented to person, place, and time.  Skin: Skin is warm and dry.  Psychiatric: She has a normal mood and affect.  Nursing note and vitals reviewed.   ED Treatments / Results  Labs (all labs ordered are listed, but only abnormal results are displayed) Labs Reviewed  COMPREHENSIVE METABOLIC PANEL  CBC  URINALYSIS, ROUTINE W REFLEX MICROSCOPIC  POC URINE PREG, ED    EKG  EKG Interpretation None       Radiology No results found.  Procedures Procedures (including critical care time)  Medications Ordered in ED Medications - No data to display   Initial Impression / Assessment and Plan / ED Course  I have reviewed the triage vital signs and the nursing notes.  Pertinent labs & imaging results that were available during my care of the patient were reviewed by me and considered in my medical decision making (see chart for details).    Pt presents with back pain and N/V. Says she's had right lower back pain w/sciatica for "a long time"; today she felt like it was worse than normal, so she applied an OTC topical pain patch in hopes of relieving her symptoms. She says that shortly after applying the patch this evening, she started to feel nauseated and has been throwing up since. Denies F/C, HA, lightheadedness, CP, cough, SOB, abdominal pain, dysuria. Says she doesn't have regular menstrual periods b/c she has an IUD (says she has a small one every 3-23mos).  VS & exam as above. Zofran ordered for symptoms. Labs unremarkable. UA cloudy with large leukocytes & protein; will not treat as the Pt is not complaining of urinary symptoms. Cause of the Pt's symptoms unclear, but doubt emergent etiology given history, exam, and work-up.  Explained all results to the Pt. Will d/c the Pt home w/rx for Flexeril & Zofran. Recommending the Pt f/u with her PCP. Pt acknowledged understanding of, and concurrence with the plan. ED return precautions provided. In stable condition at the time of  discharge.   Final Clinical Impressions(s) / ED Diagnoses   Final diagnoses:  Chronic right-sided low back pain without sciatica    New Prescriptions New Prescriptions   No medications on file     Jenny Reichmann, MD 09/28/16 [place], MD 09/29/16 0006

## 2016-10-15 ENCOUNTER — Encounter (HOSPITAL_COMMUNITY): Payer: Self-pay | Admitting: *Deleted

## 2016-10-15 ENCOUNTER — Emergency Department (HOSPITAL_COMMUNITY)
Admission: EM | Admit: 2016-10-15 | Discharge: 2016-10-15 | Disposition: A | Discharge status: Home / Self Care | Payer: Medicaid Other | Attending: Emergency Medicine | Admitting: Emergency Medicine

## 2016-10-15 ENCOUNTER — Emergency Department (HOSPITAL_COMMUNITY): Payer: Medicaid Other

## 2016-10-15 DIAGNOSIS — Z91040 Latex allergy status: Secondary | ICD-10-CM | POA: Insufficient documentation

## 2016-10-15 DIAGNOSIS — S61412A Laceration without foreign body of left hand, initial encounter: Secondary | ICD-10-CM | POA: Diagnosis not present

## 2016-10-15 DIAGNOSIS — Z23 Encounter for immunization: Secondary | ICD-10-CM | POA: Diagnosis not present

## 2016-10-15 DIAGNOSIS — Y9389 Activity, other specified: Secondary | ICD-10-CM | POA: Diagnosis not present

## 2016-10-15 DIAGNOSIS — Y929 Unspecified place or not applicable: Secondary | ICD-10-CM | POA: Insufficient documentation

## 2016-10-15 DIAGNOSIS — Z87891 Personal history of nicotine dependence: Secondary | ICD-10-CM | POA: Diagnosis not present

## 2016-10-15 DIAGNOSIS — W260XXA Contact with knife, initial encounter: Secondary | ICD-10-CM | POA: Diagnosis not present

## 2016-10-15 DIAGNOSIS — Y999 Unspecified external cause status: Secondary | ICD-10-CM | POA: Diagnosis not present

## 2016-10-15 MED ORDER — TETANUS-DIPHTH-ACELL PERTUSSIS 5-2.5-18.5 LF-MCG/0.5 IM SUSP
0.5000 mL | Freq: Once | INTRAMUSCULAR | Status: AC
  Administered 2016-10-15: 0.5 mL via INTRAMUSCULAR
  Filled 2016-10-15: qty 0.5

## 2016-10-15 MED ORDER — LIDOCAINE-EPINEPHRINE-TETRACAINE (LET) SOLUTION
3.0000 mL | Freq: Once | NASAL | Status: AC
  Administered 2016-10-15: 3 mL via TOPICAL
  Filled 2016-10-15: qty 3

## 2016-10-15 MED ORDER — SULFAMETHOXAZOLE-TRIMETHOPRIM 800-160 MG PO TABS
1.0000 | ORAL_TABLET | Freq: Two times a day (BID) | ORAL | 0 refills | Status: AC
Start: 2016-10-15 — End: 2016-10-22

## 2016-10-15 NOTE — Discharge Instructions (Signed)
Return if any problems.

## 2016-10-15 NOTE — ED Notes (Signed)
Pt and visitor given [place] sandwiches and cokes.

## 2016-10-15 NOTE — ED Provider Notes (Signed)
[facility] DEPT Provider Note   CSN: [id number] Arrival date & time: 10/15/16  1114  By signing my name below, I, Ethelle Lyon Long, attest that this documentation has been prepared under the direction and in the presence of Helen Hashimoto, [place]. Electronically Signed: Ethelle Lyon Long, Scribe. 10/15/2016. 12:57 PM.  History   Chief Complaint Chief Complaint  Patient presents with  . Extremity Laceration   The history is provided by the patient and medical records. No language interpreter was used.    HPI Comments:  Melinda Barnes is a 26 y.o. female with no pertinent PMhx, who presents to the Emergency Department complaining of sudden onset, constant, 10/10 left hand pain s/p cutting it on a knife last night at 9:06PM. Pt was cutting hamburger steak last night when she accidentally struck her left palm with the knife. Bleeding is controlled at this time. She tried cleaning the wound at home PTA with no relief of her pain. Pain is exacerbated by palpation and movement. Pt denies fever, and any other complaints at this time. She is unsure of last TDAP vaccination.   Past Medical History:  Diagnosis Date  . Medical history non-contributory    Patient Active Problem List   Diagnosis Date Noted  . Anemia affecting pregnancy 09/08/2015  . Indication for care in labor or delivery 09/07/2015  . Normal vaginal delivery 09/07/2015  . Bloody discharge from nipple 05/01/2015  . Trichomonas infection--dx 02/16/15, still present 04/21/15 05/01/2015  . 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 Migraine 03/31/2015  . 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 NO PAST SURGERIES     OB History    Gravida Para Term Preterm AB Living   3 3 3     1    SAB TAB Ectopic Multiple Live Births         0 1       Home Medications    Prior to Admission medications   Medication Sig Start Date End Date Taking? Authorizing Provider  ferrous sulfate 325 (65 FE) MG  tablet Take 1 tablet (325 mg total) by mouth 2 (two) times daily with a meal. Patient not taking: Reported on 09/28/2016 09/09/15   Venus Standard, CNM  ibuprofen (ADVIL,MOTRIN) 600 MG tablet Take 1 tablet (600 mg total) by mouth every 6 (six) hours. Patient not taking: Reported on 09/28/2016 09/09/15   Venus Standard, CNM  senna-docusate (SENOKOT-S) 8.6-50 MG tablet Take 2 tablets by mouth at bedtime as needed for mild constipation. Patient not taking: Reported on 09/28/2016 09/09/15   Venus Standard, CNM   Family History Family History  Problem Relation Age of Onset  . Asthma Mother    Social History Social History  Substance Use Topics  . Smoking status: Former Smoker    Packs/day: 1.00    Years: 2.00    Types: Cigarettes    Quit date: 05/22/2008  . Smokeless tobacco: Never Used  . Alcohol use No   Allergies   Latex   Review of Systems Review of Systems  Constitutional: Negative for fever.  Skin: Positive for wound.  All other systems reviewed and are negative.   Physical Exam Updated Vital Signs BP 124/82 (BP Location: Right Arm)   Pulse 75   Temp 98.3 F (36.8 C) (Oral)   Resp 16   LMP  (LMP Unknown)   SpO2 100%   Physical Exam  Constitutional: She is oriented to person, place, and time. She appears well-developed and  well-nourished.  HENT:  Head: Normocephalic.  Eyes: Conjunctivae are normal.  Cardiovascular: Normal rate.   Pulmonary/Chest: Effort normal.  Abdominal: She exhibits no distension.  Musculoskeletal: Normal range of motion.  Neurological: She is alert and oriented to person, place, and time.  Skin: Skin is warm and dry.  1cm superficial abrasion  Psychiatric: She has a normal mood and affect.  Nursing note and vitals reviewed.    ED Treatments / Results  DIAGNOSTIC STUDIES:  Oxygen Saturation is 100% on RA, normal by my interpretation.    COORDINATION OF CARE:  12:45 PM Discussed treatment plan with pt at bedside including cleaning of the  wound, TDAP vacciantion, ABX, and XR of left hand and pt agreed to plan.  Labs (all labs ordered are listed, but only abnormal results are displayed) Labs Reviewed - No data to display  EKG  EKG Interpretation None       Radiology Dg Hand Complete Left  Result Date: 10/15/2016 CLINICAL DATA:  26 y/o F; laceration to the palm are site of the hand under the fifth digit at the midpole level. EXAM: LEFT HAND - COMPLETE 3+ VIEW COMPARISON:  None. FINDINGS: There is no evidence of fracture or dislocation. There is no evidence of arthropathy or other focal bone abnormality. No radiopaque foreign body. IMPRESSION: No acute bony articular abnormality identified. No radiopaque foreign body. Electronically Signed   By: Kristine Garbe M.D.   On: 10/15/2016 14:39    Procedures Procedures (including critical care time)  Medications Ordered in ED Medications  Tdap (BOOSTRIX) injection 0.5 mL (0.5 mLs Intramuscular Given 10/15/16 1253)  lidocaine-EPINEPHrine-tetracaine (LET) solution (3 mLs Topical Given 10/15/16 1251)     Initial Impression / Assessment and Plan / ED Course  I have reviewed the triage vital signs and the nursing notes.  Pertinent labs & imaging results that were available during my care of the patient were reviewed by me and considered in my medical decision making (see chart for details).    Tetanus updated in ED with wound care to the left palmar aspect. Abrasion occurred > 12 hours prior to arrival in ED. Discussed abrasion care with pt and answered questions surrounding Rx'd Abx. Pt to f-u with UC for wound check in 2 days, wound check sooner should there be signs of dehiscence or infection. Pt is hemodynamically stable with no complaints prior to dc.      Final Clinical Impressions(s) / ED Diagnoses   Final diagnoses:  Laceration of left hand without foreign body, initial encounter    New Prescriptions Discharge Medication List as of 10/15/2016  2:50 PM      START taking these medications   Details  sulfamethoxazole-trimethoprim (BACTRIM DS,SEPTRA DS) 800-160 MG tablet Take 1 tablet by mouth 2 (two) times daily., Starting Mon 10/15/2016, Until Mon 10/22/2016, Print        Wound care An After Visit Summary was printed and given to the patient. Meds ordered this encounter  Medications  . Tdap (BOOSTRIX) injection 0.5 mL  . lidocaine-EPINEPHrine-tetracaine (LET) solution  . sulfamethoxazole-trimethoprim (BACTRIM DS,SEPTRA DS) 800-160 MG tablet    Sig: Take 1 tablet by mouth 2 (two) times daily.    Dispense:  14 tablet    Refill:  0    Order Specific Question:   Supervising Provider    Answer:   Noemi Chapel [[id number]]    I personally performed the services in this documentation, which was scribed in my presence.  The recorded information has been reviewed  and considered.   Ronnald Collum.   Hollace Kinnier [place], PA-C 10/15/16 [place], MD 10/15/16 [phone]

## 2016-10-15 NOTE — ED Triage Notes (Signed)
Pt states that she was cutting a hamburger steak last night when the tip of the knife went into her left hand. Bleeding controlled.

## 2017-12-13 ENCOUNTER — Other Ambulatory Visit: Payer: Self-pay

## 2017-12-13 ENCOUNTER — Emergency Department (HOSPITAL_COMMUNITY)
Admission: EM | Admit: 2017-12-13 | Discharge: 2017-12-13 | Disposition: A | Discharge status: Home / Self Care | Payer: Self-pay | Attending: Emergency Medicine | Admitting: Emergency Medicine

## 2017-12-13 ENCOUNTER — Emergency Department (HOSPITAL_COMMUNITY): Payer: Self-pay

## 2017-12-13 ENCOUNTER — Encounter (HOSPITAL_COMMUNITY): Payer: Self-pay

## 2017-12-13 DIAGNOSIS — Z87891 Personal history of nicotine dependence: Secondary | ICD-10-CM | POA: Insufficient documentation

## 2017-12-13 DIAGNOSIS — Z91040 Latex allergy status: Secondary | ICD-10-CM | POA: Insufficient documentation

## 2017-12-13 DIAGNOSIS — M25571 Pain in right ankle and joints of right foot: Secondary | ICD-10-CM | POA: Insufficient documentation

## 2017-12-13 MED ORDER — IBUPROFEN 800 MG PO TABS
800.0000 mg | ORAL_TABLET | Freq: Once | ORAL | Status: AC
Start: 2017-12-13 — End: 2017-12-13
  Administered 2017-12-13: 800 mg via ORAL
  Filled 2017-12-13: qty 1

## 2017-12-13 MED ORDER — IBUPROFEN 600 MG PO TABS: 600.0000 mg | ORAL_TABLET | Freq: Four times a day (QID) | ORAL | 0 refills | Status: DC
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
## 2017-12-14 ENCOUNTER — Encounter (HOSPITAL_COMMUNITY): Payer: Self-pay

## 2017-12-14 ENCOUNTER — Emergency Department (HOSPITAL_COMMUNITY)
Admission: EM | Admit: 2017-12-14 | Discharge: 2017-12-14 | Disposition: A | Discharge status: Home / Self Care | Payer: Medicaid Other | Attending: Emergency Medicine | Admitting: Emergency Medicine

## 2017-12-14 ENCOUNTER — Other Ambulatory Visit: Payer: Self-pay

## 2017-12-14 DIAGNOSIS — S161XXA Strain of muscle, fascia and tendon at neck level, initial encounter: Secondary | ICD-10-CM | POA: Insufficient documentation

## 2017-12-14 DIAGNOSIS — Z87891 Personal history of nicotine dependence: Secondary | ICD-10-CM | POA: Insufficient documentation

## 2017-12-14 DIAGNOSIS — Z91040 Latex allergy status: Secondary | ICD-10-CM | POA: Insufficient documentation

## 2017-12-14 DIAGNOSIS — Y939 Activity, unspecified: Secondary | ICD-10-CM | POA: Insufficient documentation

## 2017-12-14 DIAGNOSIS — Y92410 Unspecified street and highway as the place of occurrence of the external cause: Secondary | ICD-10-CM | POA: Insufficient documentation

## 2017-12-14 DIAGNOSIS — Y998 Other external cause status: Secondary | ICD-10-CM | POA: Insufficient documentation

## 2017-12-14 MED ORDER — METHOCARBAMOL 500 MG PO TABS: 500.0000 mg | ORAL_TABLET | Freq: Every evening | ORAL | 0 refills | Status: DC | PRN

## 2017-12-14 NOTE — Discharge Instructions (Addendum)
Take ibuprofen 3 times a day with meals.  Do not take other anti-inflammatories at the same time open (Advil, Motrin, naproxen, Aleve). You may supplement with Tylenol if you need further pain control. Use robaxin as needed for muscle stiffness or soreness.  Have caution, this may make you tired or groggy.  Do not drive or operate heavy machinery while taking this medicine. Use ice packs or heating pads if this helps control your pain. Use muscle creams (salonpas, icy hot, bengay) as needed for pain. Massage and stretch your muscles. This may hurt be sore, but will help them feel better.  You will likely have continued muscle stiffness and soreness over the next couple days.  Follow-up with urgent care in 1 week if your symptoms are not improving. Return to the emergency room if you develop vision changes, vomiting, slurred speech, numbness, loss of bowel or bladder control, or any new or worsening symptoms.
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
## 2018-09-15 ENCOUNTER — Emergency Department (HOSPITAL_COMMUNITY)
Admission: EM | Admit: 2018-09-15 | Discharge: 2018-09-15 | Disposition: A | Discharge status: Home / Self Care | Payer: Medicaid Other | Attending: Emergency Medicine | Admitting: Emergency Medicine

## 2018-09-15 ENCOUNTER — Other Ambulatory Visit: Payer: Self-pay

## 2018-09-15 ENCOUNTER — Emergency Department (HOSPITAL_COMMUNITY): Payer: Medicaid Other

## 2018-09-15 DIAGNOSIS — Z87891 Personal history of nicotine dependence: Secondary | ICD-10-CM | POA: Insufficient documentation

## 2018-09-15 DIAGNOSIS — R109 Unspecified abdominal pain: Secondary | ICD-10-CM | POA: Insufficient documentation

## 2018-09-15 DIAGNOSIS — Z79899 Other long term (current) drug therapy: Secondary | ICD-10-CM | POA: Insufficient documentation

## 2018-09-15 DIAGNOSIS — R197 Diarrhea, unspecified: Secondary | ICD-10-CM | POA: Insufficient documentation

## 2018-09-15 DIAGNOSIS — M25561 Pain in right knee: Secondary | ICD-10-CM | POA: Insufficient documentation

## 2018-09-15 DIAGNOSIS — Z91040 Latex allergy status: Secondary | ICD-10-CM | POA: Insufficient documentation

## 2018-09-15 DIAGNOSIS — R112 Nausea with vomiting, unspecified: Secondary | ICD-10-CM

## 2018-09-15 LAB — COMPREHENSIVE METABOLIC PANEL
ALBUMIN: 3.3 g/dL — AB (ref 3.5–5.0)
ALK PHOS: 52 U/L (ref 38–126)
ALT: 32 U/L (ref 0–44)
AST: 19 U/L (ref 15–41)
Anion gap: 6 (ref 5–15)
BILIRUBIN TOTAL: 0.4 mg/dL (ref 0.3–1.2)
BUN: 19 mg/dL (ref 6–20)
CALCIUM: 8.5 mg/dL — AB (ref 8.9–10.3)
CO2: 24 mmol/L (ref 22–32)
Chloride: 107 mmol/L (ref 98–111)
Creatinine, Ser: 0.81 mg/dL (ref 0.44–1.00)
GFR calc non Af Amer: >60 mL/min (ref >60)
GLUCOSE: 76 mg/dL (ref 70–99)
POTASSIUM: 3.8 mmol/L (ref 3.5–5.1)
Sodium: 137 mmol/L (ref 135–145)
TOTAL PROTEIN: 6.6 g/dL (ref 6.5–8.1)

## 2018-09-15 LAB — CBC
HCT: 40.8 % (ref 36.0–46.0)
HEMOGLOBIN: 12.7 g/dL (ref 12.0–15.0)
MCH: 27.3 pg (ref 26.0–34.0)
MCHC: 31.1 g/dL (ref 30.0–36.0)
MCV: 87.6 fL (ref 80.0–100.0)
PLATELETS: 250 10*3/uL (ref 150–400)
RBC: 4.66 MIL/uL (ref 3.87–5.11)
RDW: 14.1 % (ref 11.5–15.5)
WBC: 9.2 10*3/uL (ref 4.0–10.5)
nRBC: 0 % (ref 0.0–0.2)

## 2018-09-15 LAB — URINALYSIS, ROUTINE W REFLEX MICROSCOPIC
Bilirubin Urine: NEGATIVE
Glucose, UA: NEGATIVE mg/dL
HGB URINE DIPSTICK: NEGATIVE
Ketones, ur: NEGATIVE mg/dL
LEUKOCYTE UA: NEGATIVE
NITRITE: NEGATIVE
Protein, ur: NEGATIVE mg/dL
SPECIFIC GRAVITY, URINE: 1.026 (ref 1.005–1.030)
pH: 6 (ref 5.0–8.0)

## 2018-09-15 LAB — LIPASE, BLOOD: Lipase: 29 U/L (ref 11–51)

## 2018-09-15 LAB — HCG, QUANTITATIVE, PREGNANCY: hCG, Beta Chain, Quant, S: 1 m[IU]/mL (ref <5)

## 2018-09-15 MED ORDER — ONDANSETRON 4 MG PO TBDP
4.0000 mg | ORAL_TABLET | Freq: Once | ORAL | Status: AC
  Administered 2018-09-15: 4 mg via ORAL
  Filled 2018-09-15: qty 1

## 2018-09-15 MED ORDER — ONDANSETRON 4 MG PO TBDP: 4.0000 mg | ORAL_TABLET | Freq: Three times a day (TID) | ORAL | 0 refills | Status: DC | PRN

## 2018-09-15 NOTE — Discharge Instructions (Signed)
Your work-up today was overall reassuring.  Please use the nausea medicine to help prevent your vomiting and drink lots of fluids to stay hydrated.  We did not see any significant abnormality on your work-up and we do not feel imaging was necessary based on your reassuring exam.  We suspect you may have a viral enteritis causing your nausea and vomiting and discomfort.  Please follow-up with a primary doctor.  If any symptoms change or worsen, please return to the nearest emergency room.

## 2018-09-15 NOTE — ED Provider Notes (Signed)
[facility] EMERGENCY DEPARTMENT Provider Note   CSN: [id number] Arrival date & time: 09/15/18  1952    History   Chief Complaint Chief Complaint  Patient presents with  . Emesis    HPI Melinda Barnes is a 28 y.o. female.     The history is provided by the patient and medical records. No language interpreter was used.  Emesis  Severity:  Moderate Duration:  5 days Timing:  Intermittent Quality:  Stomach contents Progression:  Unchanged Chronicity:  New Recent urination:  Normal Relieved by:  Nothing Worsened by:  Nothing Ineffective treatments:  None tried Associated symptoms: abdominal pain and diarrhea   Associated symptoms: no arthralgias, no chills, no cough, no fever, no headaches, no myalgias, no sore throat and no URI   Risk factors: no alcohol use, no diabetes, not pregnant, no prior abdominal surgery and no sick contacts     Past Medical History:  Diagnosis Date  . Medical history non-contributory     Patient Active Problem List   Diagnosis Date Noted  . Anemia affecting pregnancy 09/08/2015  . Indication for care in labor or delivery 09/07/2015  . Normal vaginal delivery 09/07/2015  . Bloody discharge from nipple 05/01/2015  . Trichomonas infection--dx 02/16/15, still present 04/21/15 05/01/2015  . 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 Migraine 03/31/2015  . 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 NO PAST SURGERIES       OB History    Gravida  3   Para  3   Term  3   Preterm      AB      Living  1     SAB      TAB      Ectopic      Multiple  0   Live Births  1            Home Medications    Prior to Admission medications   Medication Sig Start Date End Date Taking? Authorizing Provider  levonorgestrel (MIRENA) 20 MCG/24HR IUD 1 each by Intrauterine route once. Implanted May 2017   Yes [provider]  ferrous sulfate 325 (65 FE) MG tablet Take 1  tablet (325 mg total) by mouth 2 (two) times daily with a meal. Patient not taking: Reported on 09/28/2016 09/09/15   Standard, Venus, CNM  ibuprofen (ADVIL,MOTRIN) 600 MG tablet Take 1 tablet (600 mg total) by mouth every 6 (six) hours. Patient not taking: Reported on 09/15/2018 12/13/17   Antonietta Breach, PA-C  methocarbamol (ROBAXIN) 500 MG tablet Take 1 tablet (500 mg total) by mouth at bedtime as needed for muscle spasms. Patient not taking: Reported on 09/15/2018 12/14/17   Caccavale, Sophia, PA-C  senna-docusate (SENOKOT-S) 8.6-50 MG tablet Take 2 tablets by mouth at bedtime as needed for mild constipation. Patient not taking: Reported on 09/28/2016 09/09/15   Standard, Venus, CNM    Family History Family History  Problem Relation Age of Onset  . Asthma Mother     Social History Social History   Tobacco Use  . Smoking status: Former Smoker    Packs/day: 1.00    Years: 2.00    Pack years: 2.00    Types: Cigars    Last attempt to quit: 05/22/2008    Years since quitting: 10.3  . Smokeless tobacco: Never Used  Substance Use Topics  . Alcohol use: No  . Drug use: No     Allergies  Latex   Review of Systems Review of Systems  Constitutional: Negative for chills, diaphoresis, fatigue and fever.  HENT: Negative for congestion and sore throat.   Respiratory: Negative for apnea, cough, chest tightness and shortness of breath.   Cardiovascular: Negative for chest pain and palpitations.  Gastrointestinal: Positive for abdominal pain, diarrhea, nausea and vomiting. Negative for blood in stool and constipation.  Genitourinary: Negative for dysuria, flank pain and frequency.  Musculoskeletal: Negative for arthralgias, back pain, myalgias, neck pain and neck stiffness.  Skin: Negative for rash and wound.  Neurological: Negative for headaches.  Psychiatric/Behavioral: Negative for agitation.  All other systems reviewed and are negative.    Physical Exam Updated Vital Signs BP  108/64 (BP Location: Right Arm)   Pulse 64   Temp 98.6 F (37 C) (Oral)   Resp 18   Ht 5\' 4"  (1.626 m)   Wt 59 kg   LMP 08/09/2018 (Approximate)   SpO2 100%   BMI 22.31 kg/m   Physical Exam Vitals signs and nursing note reviewed.  Constitutional:      General: She is not in acute distress.    Appearance: She is well-developed. She is not ill-appearing, toxic-appearing or diaphoretic.  HENT:     Head: Normocephalic and atraumatic.     Nose: Nose normal. No congestion or rhinorrhea.     Mouth/Throat:     Mouth: Mucous membranes are moist.     Pharynx: No oropharyngeal exudate or posterior oropharyngeal erythema.  Eyes:     Conjunctiva/sclera: Conjunctivae normal.     Pupils: Pupils are equal, round, and reactive to light.  Neck:     Musculoskeletal: Neck supple.  Cardiovascular:     Rate and Rhythm: Normal rate and regular rhythm.     Heart sounds: No murmur.  Pulmonary:     Effort: Pulmonary effort is normal. No respiratory distress.     Breath sounds: Normal breath sounds. No wheezing, rhonchi or rales.  Chest:     Chest wall: No tenderness.  Abdominal:     General: There is no distension.     Palpations: Abdomen is soft.     Tenderness: There is no abdominal tenderness. There is no right CVA tenderness or left CVA tenderness.  Musculoskeletal:        General: No tenderness.     Right lower leg: No edema.     Left lower leg: No edema.  Skin:    General: Skin is warm and dry.     Capillary Refill: Capillary refill takes less than 2 seconds.  Neurological:     General: No focal deficit present.     Mental Status: She is alert and oriented to person, place, and time.     Gait: Gait normal.  Psychiatric:        Mood and Affect: Mood normal.      ED Treatments / Results  Labs (all labs ordered are listed, but only abnormal results are displayed) Labs Reviewed  COMPREHENSIVE METABOLIC PANEL - Abnormal; Notable for the following components:      Result Value    Calcium 8.5 (*)    Albumin 3.3 (*)    All other components within normal limits  URINALYSIS, ROUTINE W REFLEX MICROSCOPIC - Abnormal; Notable for the following components:   APPearance HAZY (*)    All other components within normal limits  LIPASE, BLOOD  CBC  HCG, QUANTITATIVE, PREGNANCY    EKG None  Radiology Dg Knee Complete 4 Views Right  Result  Date: 09/15/2018 CLINICAL DATA:  Chronic pain EXAM: RIGHT KNEE - COMPLETE 4+ VIEW COMPARISON:  None. FINDINGS: Frontal, lateral, and bilateral oblique views were obtained. No fracture or dislocation. No joint effusion. Joint spaces appear normal. No erosive change. IMPRESSION: No fracture or dislocation. No appreciable arthropathy. No joint effusion. Electronically Signed   By: Lowella Grip III M.D.   On: 09/15/2018 21:10    Procedures Procedures (including critical care time)  Medications Ordered in ED Medications  ondansetron (ZOFRAN-ODT) disintegrating tablet 4 mg (4 mg Oral Given 09/15/18 2205)     Initial Impression / Assessment and Plan / ED Course  I have reviewed the triage vital signs and the nursing notes.  Pertinent labs & imaging results that were available during my care of the patient were reviewed by me and considered in my medical decision making (see chart for details).        Melinda Barnes is a 28 y.o. female with a past medical history significant for migraines who presents with 1 week of nausea, and vomiting.  Patient reports that while at work over the last week she has had nausea and vomiting.  She denies any new exposures to chemicals or other changes.  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  She reports is also had pain in her right knee which she injured in the past and bumped again 2 weeks ago causing the pain.  She denies any constipation or urinary symptoms.  Does report some mild diarrhea.  She reports she is having abdominal cramping that has resolved.  She denies  significant chest pain shortness of breath congestion or cough.  No significant fevers or chills.  On exam, patient's lungs are clear and chest is nontender.  Abdomen is nontender.  Back is nontender.  No CVA tenderness.  Patient resting comfortably.  Vital signs reassuring on arrival and reassessment.  Patient has routine laboratory testing performed.  Patient's pregnancy test was negative.  Normal CBC and lipase.  Metabolic panel shows mild hypocalcemia but otherwise reassuring.  Urinalysis shows no infection.  Patient had x-ray of her knee given the trauma revealing no evidence of fracture dislocation or effusion.  Suspect musculoskeletal pain.  After nausea medication, patient was able to tolerate eating and drinking and was feeling better.  Unclear etiology of her nausea and vomiting however suspect it could be a viral infection versus exposure to a irritant at work.  Patient reports he will talk to her work to learn of any new chemicals she may been exposed to as well as to rest and stay hydrated.  She will give prescription for nausea medication and will try taking it before work.  Patient voiced understanding of plan of care as well as return precautions.  She no other questions or concerns and was discharged in good condition after tolerating eating and drinking.   Final Clinical Impressions(s) / ED Diagnoses   Final diagnoses:  Non-intractable vomiting with nausea, unspecified vomiting type    ED Discharge Orders         Ordered    ondansetron (ZOFRAN ODT) 4 MG disintegrating tablet  Every 8 hours PRN     09/15/18 2252         Clinical Impression: 1. Non-intractable vomiting with nausea, unspecified vomiting type     Disposition: Discharge  Condition: Good  I have discussed the results, Dx and Tx plan with the pt(& family if present). He/she/they expressed understanding and agree(s) with the plan.  Discharge instructions discussed at great length. Strict return precautions  discussed and pt &/or family have verbalized understanding of the instructions. No further questions at time of discharge.    Discharge Medication List as of 09/15/2018 10:53 PM    START taking these medications   Details  ondansetron (ZOFRAN ODT) 4 MG disintegrating tablet Take 1 tablet (4 mg total) by mouth every 8 (eight) hours as needed for nausea or vomiting., Starting Mon 09/15/2018, Print        Follow Up: [facility] [address] [place] [address] [phone] Schedule an appointment as soon as possible for a visit    [facility] [address] [place]       Tegeler, Gwenyth Allegra, MD 09/16/18 Shelah Lewandowsky

## 2018-09-15 NOTE — ED Notes (Signed)
Patient verbalizes understanding of discharge instructions. Opportunity for questioning and answers were provided. Armband removed by staff, pt discharged from ED, ambulatory to lobby.  

## 2018-09-15 NOTE — ED Triage Notes (Addendum)
Pt c/o vomiting intermittently x1 week, but only when she is at work. Also c/o left knee pain.

## 2019-11-19 ENCOUNTER — Other Ambulatory Visit: Payer: Self-pay

## 2019-11-19 ENCOUNTER — Emergency Department (HOSPITAL_COMMUNITY)
Admission: EM | Admit: 2019-11-19 | Discharge: 2019-11-20 | Disposition: A | Discharge status: Home / Self Care | Payer: Self-pay | Attending: Emergency Medicine | Admitting: Emergency Medicine

## 2019-11-19 DIAGNOSIS — Y999 Unspecified external cause status: Secondary | ICD-10-CM | POA: Insufficient documentation

## 2019-11-19 DIAGNOSIS — Z79899 Other long term (current) drug therapy: Secondary | ICD-10-CM | POA: Insufficient documentation

## 2019-11-19 DIAGNOSIS — Y929 Unspecified place or not applicable: Secondary | ICD-10-CM | POA: Insufficient documentation

## 2019-11-19 DIAGNOSIS — W01190A Fall on same level from slipping, tripping and stumbling with subsequent striking against furniture, initial encounter: Secondary | ICD-10-CM | POA: Insufficient documentation

## 2019-11-19 DIAGNOSIS — Z91040 Latex allergy status: Secondary | ICD-10-CM | POA: Insufficient documentation

## 2019-11-19 DIAGNOSIS — Z87891 Personal history of nicotine dependence: Secondary | ICD-10-CM | POA: Insufficient documentation

## 2019-11-19 DIAGNOSIS — M545 Low back pain, unspecified: Secondary | ICD-10-CM

## 2019-11-19 DIAGNOSIS — Y9389 Activity, other specified: Secondary | ICD-10-CM | POA: Insufficient documentation

## 2019-11-19 NOTE — ED Triage Notes (Signed)
Patient states that she fell on on a box spring mattress and injured her back (right side rib area).

## 2019-11-20 ENCOUNTER — Emergency Department (HOSPITAL_COMMUNITY): Payer: Self-pay

## 2019-11-20 ENCOUNTER — Encounter (HOSPITAL_COMMUNITY): Payer: Self-pay | Admitting: Emergency Medicine

## 2019-11-20 MED ORDER — IBUPROFEN 800 MG PO TABS: 800.0000 mg | ORAL_TABLET | Freq: Three times a day (TID) | ORAL | 0 refills | Status: DC

## 2019-11-20 MED ORDER — ACETAMINOPHEN 500 MG PO TABS
1000.0000 mg | ORAL_TABLET | Freq: Once | ORAL | Status: AC
Start: 2019-11-20 — End: 2019-11-20
  Administered 2019-11-20: 1000 mg via ORAL
  Filled 2019-11-20: qty 2

## 2019-11-20 MED ORDER — LIDOCAINE 5 % EX PTCH
2.0000 | MEDICATED_PATCH | CUTANEOUS | Status: DC
  Administered 2019-11-20: 2 via TRANSDERMAL
  Filled 2019-11-20: qty 2

## 2019-11-20 MED ORDER — IBUPROFEN 800 MG PO TABS
800.0000 mg | ORAL_TABLET | Freq: Once | ORAL | Status: AC
Start: 2019-11-20 — End: 2019-11-20
  Administered 2019-11-20: 800 mg via ORAL
  Filled 2019-11-20: qty 1

## 2019-11-20 MED ORDER — LIDOCAINE 5 % EX PTCH: 1.0000 | MEDICATED_PATCH | CUTANEOUS | 0 refills | Status: DC

## 2019-11-20 NOTE — ED Provider Notes (Signed)
[facility] Provider Note   CSN: [id number] Arrival date & time: 11/19/19  2310     History Chief Complaint  Patient presents with  . Back Pain    Melinda Barnes is a 29 y.o. female.  The history is provided by the patient.  Patient with no significant PMH presents after hitting her back when she was walking on her box spring and there are a hole in it and she hit her low back.  No weakness. No numbness.  No incontinence of urine or stool.  No numbness in her groin area.  No gait abnormality.       Past Medical History:  Diagnosis Date  . Medical history non-contributory     Patient Active Problem List   Diagnosis Date Noted  . Anemia affecting pregnancy 09/08/2015  . Indication for care in labor or delivery 09/07/2015  . Normal vaginal delivery 09/07/2015  . Bloody discharge from nipple 05/01/2015  . Trichomonas infection--dx 02/16/15, still present 04/21/15 05/01/2015  . 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 Migraine 03/31/2015  . 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 NO PAST SURGERIES       OB History    Gravida  3   Para  3   Term  3   Preterm      AB      Living  1     SAB      TAB      Ectopic      Multiple  0   Live Births  1           Family History  Problem Relation Age of Onset  . Asthma Mother     Social History   Tobacco Use  . Smoking status: Former Smoker    Packs/day: 1.00    Years: 2.00    Pack years: 2.00    Types: Cigars    Quit date: 05/22/2008    Years since quitting: 11.5  . Smokeless tobacco: Never Used  Substance Use Topics  . Alcohol use: No  . Drug use: No    Home Medications Prior to Admission medications   Medication Sig Start Date End Date Taking? Authorizing Provider  ferrous sulfate 325 (65 FE) MG tablet Take 1 tablet (325 mg total) by mouth 2 (two) times daily with a meal. Patient not taking: Reported on 09/28/2016  09/09/15   Melinda Barnes, Melinda Barnes  ibuprofen (ADVIL,MOTRIN) 600 MG tablet Take 1 tablet (600 mg total) by mouth every 6 (six) hours. Patient not taking: Reported on 09/15/2018 12/13/17   Melinda Breach, Melinda Barnes  levonorgestrel [facility]) 20 MCG/24HR IUD 1 each by Intrauterine route once. Implanted May 2017    Melinda Barnes  methocarbamol (ROBAXIN) 500 MG tablet Take 1 tablet (500 mg total) by mouth at bedtime as needed for muscle spasms. Patient not taking: Reported on 09/15/2018 12/14/17   Caccavale, Sophia, Melinda Barnes  ondansetron (ZOFRAN ODT) 4 MG disintegrating tablet Take 1 tablet (4 mg total) by mouth every 8 (eight) hours as needed for nausea or vomiting. 09/15/18   Tegeler, Melinda Allegra, Barnes  senna-docusate (SENOKOT-S) 8.6-50 MG tablet Take 2 tablets by mouth at bedtime as needed for mild constipation. Patient not taking: Reported on 09/28/2016 09/09/15   Melinda Barnes, Melinda Barnes    Allergies    Latex  Review of Systems   Review of Systems  Constitutional: Negative for fever.  HENT: Negative for congestion.  Eyes: Negative for visual disturbance.  Respiratory: Negative for shortness of breath.   Cardiovascular: Negative for chest pain.  Gastrointestinal: Negative for abdominal pain.  Genitourinary: Negative for difficulty urinating.  Musculoskeletal: Positive for back pain. Negative for gait problem, neck pain and neck stiffness.  Skin: Negative for rash.  Neurological: Negative for dizziness, weakness and numbness.  Psychiatric/Behavioral: Negative for agitation.  All other systems reviewed and are negative.   Physical Exam Updated Vital Signs BP 101/68 (BP Location: Right Arm)   Pulse (!) 58   Temp 98.3 F (36.8 C) (Oral)   Resp 16   SpO2 99%   Physical Exam Vitals and nursing note reviewed.  Constitutional:      General: She is not in acute distress.    Appearance: Normal appearance.  HENT:     Head: Normocephalic and atraumatic.     Nose: Nose normal.  Eyes:      Conjunctiva/sclera: Conjunctivae normal.     Pupils: Pupils are equal, round, and reactive to light.  Cardiovascular:     Rate and Rhythm: Normal rate and regular rhythm.     Pulses: Normal pulses.     Heart sounds: Normal heart sounds.  Pulmonary:     Effort: Pulmonary effort is normal.  Abdominal:     General: Abdomen is flat. Bowel sounds are normal.     Tenderness: There is no abdominal tenderness. There is no guarding.  Musculoskeletal:        General: Normal range of motion.     Cervical back: Normal, normal range of motion and neck supple.     Thoracic back: Normal.     Lumbar back: Normal.  Skin:    General: Skin is warm and dry.     Capillary Refill: Capillary refill takes less than 2 seconds.  Neurological:     General: No focal deficit present.     Mental Status: She is alert and oriented to person, place, and time.     Deep Tendon Reflexes: Reflexes normal.  Psychiatric:        Mood and Affect: Mood normal.        Behavior: Behavior normal.     ED Results / Procedures / Treatments   Labs (all labs ordered are listed, but only abnormal results are displayed) Labs Reviewed - No data to display  EKG None  Radiology No results found.  Procedures Procedures (including critical care time)  Medications Ordered in ED Medications  acetaminophen (TYLENOL) tablet 1,000 mg (has no administration in time range)  ibuprofen (ADVIL) tablet 800 mg (has no administration in time range)  lidocaine (LIDODERM) 5 % 2 patch (has no administration in time range)    ED Course  I have reviewed the triage vital signs and the nursing notes.  Pertinent labs & imaging results that were available during my care of the patient were reviewed by me and considered in my medical decision making (see chart for details).    MSK pain, will treat with NSAIDs and lidoderm.  No acute injury.    Melinda Barnes was evaluated in Emergency Department on 11/20/2019 for the symptoms described in  the history of present illness. She was evaluated in the context of the global COVID-19 pandemic, which necessitated consideration that the patient might be at risk for infection with the SARS-CoV-2 virus that causes COVID-19. Institutional protocols and algorithms that pertain to the evaluation of patients at risk for COVID-19 are in a state of rapid change based on information released  by regulatory bodies including the CDC and federal and state organizations. These policies and algorithms were followed during the patient's care in the ED.  Final Clinical Impression(s) / ED Diagnoses Return for intractable cough, coughing up blood,fevers >100.4 unrelieved by medication, shortness of breath, intractable vomiting, chest pain, shortness of breath, weakness,numbness, changes in speech, facial asymmetry,abdominal pain, passing out,Inability to tolerate liquids or food, cough, altered mental status or any concerns. No signs of systemic illness or infection. The patient is nontoxic-appearing on exam and vital signs are within normal limits.   I have reviewed the triage vital signs and the nursing notes. Pertinent labs &imaging results that were available during my care of the patient were reviewed by me and considered in my medical decision making (see chart for details).After history, exam, and medical workup I feel the patient has beenappropriately medically screened and is safe for discharge home. Pertinent diagnoses were discussed with the patient. Patient was given return precautions.   Jquan Egelston, Barnes 11/20/19 [id number]

## 2019-11-20 NOTE — ED Notes (Signed)
Pt outside waiting.

## 2019-11-20 NOTE — ED Notes (Signed)
Pt still outside waiting

## 2020-05-02 ENCOUNTER — Other Ambulatory Visit: Payer: Self-pay

## 2020-05-07 ENCOUNTER — Other Ambulatory Visit: Payer: Self-pay

## 2020-05-07 DIAGNOSIS — Z20822 Contact with and (suspected) exposure to covid-19: Secondary | ICD-10-CM

## 2020-05-08 LAB — NOVEL CORONAVIRUS, NAA: SARS-CoV-2, NAA: NOT DETECTED

## 2020-05-08 LAB — SARS-COV-2, NAA 2 DAY TAT

## 2020-06-19 ENCOUNTER — Encounter (HOSPITAL_COMMUNITY): Payer: Self-pay

## 2020-06-19 ENCOUNTER — Emergency Department (HOSPITAL_COMMUNITY)
Admission: EM | Admit: 2020-06-19 | Discharge: 2020-06-19 | Disposition: A | Discharge status: Home / Self Care | Payer: Self-pay | Attending: Emergency Medicine | Admitting: Emergency Medicine

## 2020-06-19 ENCOUNTER — Other Ambulatory Visit: Payer: Self-pay

## 2020-06-19 ENCOUNTER — Emergency Department (HOSPITAL_COMMUNITY): Payer: Self-pay

## 2020-06-19 DIAGNOSIS — R102 Pelvic and perineal pain: Secondary | ICD-10-CM | POA: Insufficient documentation

## 2020-06-19 DIAGNOSIS — Z202 Contact with and (suspected) exposure to infections with a predominantly sexual mode of transmission: Secondary | ICD-10-CM | POA: Insufficient documentation

## 2020-06-19 DIAGNOSIS — N760 Acute vaginitis: Secondary | ICD-10-CM | POA: Insufficient documentation

## 2020-06-19 DIAGNOSIS — Z91040 Latex allergy status: Secondary | ICD-10-CM | POA: Insufficient documentation

## 2020-06-19 DIAGNOSIS — B9689 Other specified bacterial agents as the cause of diseases classified elsewhere: Secondary | ICD-10-CM

## 2020-06-19 DIAGNOSIS — Z87891 Personal history of nicotine dependence: Secondary | ICD-10-CM | POA: Insufficient documentation

## 2020-06-19 DIAGNOSIS — Z711 Person with feared health complaint in whom no diagnosis is made: Secondary | ICD-10-CM

## 2020-06-19 LAB — WET PREP, GENITAL
Sperm: NONE SEEN
Trich, Wet Prep: NONE SEEN
Yeast Wet Prep HPF POC: NONE SEEN

## 2020-06-19 LAB — URINALYSIS, ROUTINE W REFLEX MICROSCOPIC
Bacteria, UA: NONE SEEN
Bilirubin Urine: NEGATIVE
Glucose, UA: NEGATIVE mg/dL
Ketones, ur: NEGATIVE mg/dL
Nitrite: NEGATIVE
Protein, ur: NEGATIVE mg/dL
Specific Gravity, Urine: 1.017 (ref 1.005–1.030)
pH: 6 (ref 5.0–8.0)

## 2020-06-19 LAB — RAPID HIV SCREEN (HIV 1/2 AB+AG)
HIV 1/2 Antibodies: NONREACTIVE
HIV-1 P24 Antigen - HIV24: NONREACTIVE

## 2020-06-19 LAB — CBC
HCT: 37.2 % (ref 36.0–46.0)
Hemoglobin: 12.3 g/dL (ref 12.0–15.0)
MCH: 28.9 pg (ref 26.0–34.0)
MCHC: 33.1 g/dL (ref 30.0–36.0)
MCV: 87.3 fL (ref 80.0–100.0)
Platelets: 240 10*3/uL (ref 150–400)
RBC: 4.26 MIL/uL (ref 3.87–5.11)
RDW: 14.6 % (ref 11.5–15.5)
WBC: 8.6 10*3/uL (ref 4.0–10.5)
nRBC: 0 % (ref 0.0–0.2)

## 2020-06-19 LAB — LIPASE, BLOOD: Lipase: 23 U/L (ref 11–51)

## 2020-06-19 LAB — COMPREHENSIVE METABOLIC PANEL
ALT: 22 U/L (ref 0–44)
AST: 15 U/L (ref 15–41)
Albumin: 3.2 g/dL — ABNORMAL LOW (ref 3.5–5.0)
Alkaline Phosphatase: 52 U/L (ref 38–126)
Anion gap: 7 (ref 5–15)
BUN: 16 mg/dL (ref 6–20)
CO2: 24 mmol/L (ref 22–32)
Calcium: 8.7 mg/dL — ABNORMAL LOW (ref 8.9–10.3)
Chloride: 106 mmol/L (ref 98–111)
Creatinine, Ser: 0.88 mg/dL (ref 0.44–1.00)
GFR, Estimated: >60 mL/min (ref >60)
Glucose, Bld: 96 mg/dL (ref 70–99)
Potassium: 4.2 mmol/L (ref 3.5–5.1)
Sodium: 137 mmol/L (ref 135–145)
Total Bilirubin: 0.4 mg/dL (ref 0.3–1.2)
Total Protein: 6.5 g/dL (ref 6.5–8.1)

## 2020-06-19 LAB — I-STAT BETA HCG BLOOD, ED (MC, WL, AP ONLY): I-stat hCG, quantitative: <5 m[IU]/mL (ref <5)

## 2020-06-19 MED ORDER — METRONIDAZOLE 500 MG PO TABS: 500.0000 mg | ORAL_TABLET | Freq: Two times a day (BID) | ORAL | 0 refills | Status: DC

## 2020-06-19 MED ORDER — HYDROCODONE-ACETAMINOPHEN 5-325 MG PO TABS
1.0000 | ORAL_TABLET | Freq: Once | ORAL | Status: AC
  Administered 2020-06-19: 15:00:00 1 via ORAL
  Filled 2020-06-19: qty 1

## 2020-06-19 MED ORDER — FENTANYL CITRATE (PF) 100 MCG/2ML IJ SOLN: 25.0000 ug | Freq: Once | INTRAMUSCULAR | Status: DC

## 2020-06-19 MED ORDER — DICYCLOMINE HCL 10 MG PO CAPS
10.0000 mg | ORAL_CAPSULE | Freq: Once | ORAL | Status: AC
  Administered 2020-06-19: 15:00:00 10 mg via ORAL
  Filled 2020-06-19: qty 1

## 2020-06-19 NOTE — ED Triage Notes (Signed)
Patient complains of abdominal pain with radiation to lower abdomen and radiating to right flank with nausea. Denies dysuria. Denies vomiting and diarrhea. Patient alert and oriented, NAD

## 2020-06-19 NOTE — ED Notes (Signed)
Pt c/o clear/milky discharge and has an odor at times.

## 2020-06-19 NOTE — Discharge Instructions (Signed)
We will contact you with the results of your remaining lab work when it is available. Take the Flagyl for 7 days.

## 2020-06-19 NOTE — ED Provider Notes (Signed)
Pt care assumed at 3:00 PM from [facility], PA-C. Please see her note for full information on pt's initial presentation. Briefly,  PMHx - anemia  Presented for 1 week lower abdominal pain radiating down from umbilicus to bilateral lower abd and now radiating to R flank and back  Notes recent increased vaginal discharge  On pelvic exam, noted to have significant yellow discharge  Wet prep significant for BV - has been started on flagyl and full course ordered for likely d/c  Pt would like to wait on GC/C results prior to treating  Pelvic u/s pending  Plan at time of signout: - Follow up pelvic ultrasound. Anticipate likely d/c and plans for follow up with OB/GYN.  Physical Exam  BP 105/78 (BP Location: Right Arm)   Pulse (!) 51   Temp 98.3 F (36.8 C) (Oral)   Resp 14   SpO2 100%   Physical Exam Vitals and nursing note reviewed.  Constitutional:      General: She is not in acute distress.    Appearance: She is well-developed and well-nourished.  HENT:     Head: Normocephalic and atraumatic.  Eyes:     General: No scleral icterus.    Extraocular Movements: Extraocular movements intact.  Pulmonary:     Effort: Pulmonary effort is normal. No respiratory distress.  Abdominal:     General: There is no distension.     Palpations: Abdomen is soft.     Tenderness: There is abdominal tenderness (Bilateral lower quadrants of abdomen as well as suprapubic region).  Musculoskeletal:        General: No edema.     Cervical back: Neck supple.  Skin:    General: Skin is warm and dry.  Neurological:     Mental Status: She is alert.     Comments: Alert, grossly oriented, moves all extremities spontaneously, following commands appropriately  Psychiatric:        Mood and Affect: Mood and affect normal.        Behavior: Behavior normal.     ED Course/Procedures   Clinical Course as of 06/19/20 2229  Sun Jun 19, 2020  1340 Chalmers GuestMarland Kitchen): SMALL [HK]  1340 Bacteria, UA: NONE  SEEN [HK]  1340 WBC: 8.6 [HK]  1340 I-stat hCG, quantitative: <5.0 [HK]  8676 Patient would like to wait on STD testing prior to treatment. [HK]    Clinical Course User Index [HK] Delia Heady, PA-C    Procedures  MDM  Patient well-appearing on initial evaluation. She is still having some mild lower abdominal discomfort and is mildly tender on exam without peritoneal signs. Ultrasound results reviewed and show trace amount of nonspecific endometrial fluid with IUD in expected location at upper uterine segment. Ovaries normal appearing and with normal flow on doppler evaluation.  We discussed her recent symptoms and she feels comfortable with starting treatment on Flagyl and planning to follow-up with OB/GYN for further evaluation of her IUD (states it is approximately 30 years old) and pelvic pain. Location of patient's pain is not consistent with appendicitis. She denies dysuria but does have urinary frequency; reviewed urinalysis and it shows small leukocytes but negative nitrites and bacteria. Suspect that patient's urinary frequency may be more reflective of swelling and urethral or vulvar irritation from possible STD than from UTI. Pt states she has has UTIs in the past and this does not feel similar. There are no RBCs to suggest nephrolithiasis.  At this time, pt appears safe for discharge with strict return precautions  provided and instructions for follow up given. Pt voiced understanding and is amenable to this plan. Pt discharged in stable condition.   Vanna Scotland, MD [id number]    Blanchie Dessert, MD 06/19/20 2358

## 2020-06-19 NOTE — ED Notes (Signed)
Patient transported to Ultrasound 

## 2020-06-19 NOTE — ED Provider Notes (Signed)
[facility] EMERGENCY DEPARTMENT Provider Note   CSN: [id number] Arrival date & time: 06/19/20  [id number]     History No chief complaint on file.   Melinda Barnes is a 29 y.o. female with a past medical history of anemia presenting to the ED with a chief complaint of abdominal pain.  Reports 1 week history of lower abdominal pain radiating to around her bellybutton and now radiating to her right flank and right back.  States his symptoms have gradually worsened.  She tried taking Tums without improvement in her symptoms.  Reports nausea but denies any vomiting.  No changes to bowel movements or urination.  She does report significant discharge and concern that her IUD may be displaced as she has not checked it for 3 years.  She states that she is sexually active and would like to be checked for STDs.  Denies any fevers, shortness of breath, chest pain, sick contacts with similar symptoms.  No prior abdominal surgeries.  HPI     Past Medical History:  Diagnosis Date  . Medical history non-contributory     Patient Active Problem List   Diagnosis Date Noted  . Anemia affecting pregnancy 09/08/2015  . Indication for care in labor or delivery 09/07/2015  . Normal vaginal delivery 09/07/2015  . Bloody discharge from nipple 05/01/2015  . Trichomonas infection--dx 02/16/15, still present 04/21/15 05/01/2015  . 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 Migraine 03/31/2015  . 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 NO PAST SURGERIES       OB History    Gravida  3   Para  3   Term  3   Preterm      AB      Living  1     SAB      IAB      Ectopic      Multiple  0   Live Births  1           Family History  Problem Relation Age of Onset  . Asthma Mother     Social History   Tobacco Use  . Smoking status: Former Smoker    Packs/day: 1.00    Years: 2.00    Pack years: 2.00    Types: Cigars    Quit date:  05/22/2008    Years since quitting: 12.0  . Smokeless tobacco: Never Used  Vaping Use  . Vaping Use: Never used  Substance Use Topics  . Alcohol use: No  . Drug use: No    Home Medications Prior to Admission medications   Medication Sig Start Date End Date Taking? Authorizing Provider  Biotin w/ Vitamins C & E (HAIR/SKIN/NAILS) 1250-7.5-7.5 MCG-MG-UNT CHEW Chew 2 tablets by mouth at bedtime.   Yes [provider]  ferrous sulfate 325 (65 FE) MG tablet Take 1 tablet (325 mg total) by mouth 2 (two) times daily with a meal. Patient not taking: No sig reported 09/09/15   Standard, Venus, CNM  ibuprofen (ADVIL) 800 MG tablet Take 1 tablet (800 mg total) by mouth 3 (three) times daily. Patient not taking: No sig reported 11/20/19   Palumbo, April, MD  ibuprofen (ADVIL,MOTRIN) 600 MG tablet Take 1 tablet (600 mg total) by mouth every 6 (six) hours. Patient not taking: No sig reported 12/13/17   Antonietta Breach, PA-C  lidocaine (LIDODERM) 5 % Place 1 patch onto the skin daily. Remove & Discard patch within 12 hours  or as directed by MD Patient not taking: No sig reported 11/20/19   Palumbo, April, MD  methocarbamol (ROBAXIN) 500 MG tablet Take 1 tablet (500 mg total) by mouth at bedtime as needed for muscle spasms. Patient not taking: No sig reported 12/14/17   Caccavale, Sophia, PA-C  metroNIDAZOLE (FLAGYL) 500 MG tablet Take 1 tablet (500 mg total) by mouth 2 (two) times daily. 06/19/20   Cerra Eisenhower, PA-C  ondansetron (ZOFRAN ODT) 4 MG disintegrating tablet Take 1 tablet (4 mg total) by mouth every 8 (eight) hours as needed for nausea or vomiting. Patient not taking: No sig reported 09/15/18   Tegeler, Gwenyth Allegra, MD  senna-docusate (SENOKOT-S) 8.6-50 MG tablet Take 2 tablets by mouth at bedtime as needed for mild constipation. Patient not taking: No sig reported 09/09/15   Standard, Venus, CNM    Allergies    Latex  Review of Systems   Review of Systems  Constitutional: Negative  for appetite change, chills and fever.  HENT: Negative for ear pain, rhinorrhea, sneezing and sore throat.   Eyes: Negative for photophobia and visual disturbance.  Respiratory: Negative for cough, chest tightness, shortness of breath and wheezing.   Cardiovascular: Negative for chest pain and palpitations.  Gastrointestinal: Positive for abdominal pain and nausea. Negative for blood in stool, constipation, diarrhea and vomiting.  Genitourinary: Positive for vaginal discharge. Negative for dysuria, hematuria, urgency and vaginal bleeding.  Musculoskeletal: Negative for myalgias.  Skin: Negative for rash.  Neurological: Negative for dizziness, weakness and light-headedness.    Physical Exam Updated Vital Signs BP 103/71   Pulse 62   Temp 98.6 F (37 C) (Oral)   Resp (!) 25   SpO2 98%   Physical Exam Vitals and nursing note reviewed. Exam conducted with a chaperone present.  Constitutional:      General: She is not in acute distress.    Appearance: She is well-developed and well-nourished.  HENT:     Head: Normocephalic and atraumatic.     Nose: Nose normal.  Eyes:     General: No scleral icterus.       Left eye: No discharge.     Extraocular Movements: EOM normal.     Conjunctiva/sclera: Conjunctivae normal.  Cardiovascular:     Rate and Rhythm: Normal rate and regular rhythm.     Pulses: Intact distal pulses.     Heart sounds: Normal heart sounds. No murmur heard. No friction rub. No gallop.   Pulmonary:     Effort: Pulmonary effort is normal. No respiratory distress.     Breath sounds: Normal breath sounds.  Abdominal:     General: Bowel sounds are normal. There is no distension.     Palpations: Abdomen is soft.     Tenderness: There is abdominal tenderness in the right lower quadrant and suprapubic area. There is right CVA tenderness. There is no guarding.  Genitourinary:    Comments: Pelvic exam: normal external genitalia without evidence of trauma. VULVA: normal  appearing vulva with no masses, tenderness or lesion. VAGINA: normal appearing vagina with normal color and large amount of yellow/white vaginal discharge, no lesions. CERVIX: normal appearing cervix without lesions, cervical motion tenderness absent, cervical os closed with out purulent discharge;  Wet prep and DNA probe for chlamydia and GC obtained.   ADNEXA: normal adnexa in size, with bilateral tenderness.  No masses. UTERUS: uterus is normal size, shape, consistency and mildly tender on bimanual exam IUD strings are visualized Musculoskeletal:  General: No edema. Normal range of motion.     Cervical back: Normal range of motion and neck supple.  Skin:    General: Skin is warm and dry.     Findings: No rash.  Neurological:     Mental Status: She is alert.     Motor: No abnormal muscle tone.     Coordination: Coordination normal.  Psychiatric:        Mood and Affect: Mood and affect normal.     ED Results / Procedures / Treatments   Labs (all labs ordered are listed, but only abnormal results are displayed) Labs Reviewed  WET PREP, GENITAL - Abnormal; Notable for the following components:      Result Value   Clue Cells Wet Prep HPF POC PRESENT (*)    WBC, Wet Prep HPF POC MANY (*)    All other components within normal limits  COMPREHENSIVE METABOLIC PANEL - Abnormal; Notable for the following components:   Calcium 8.7 (*)    Albumin 3.2 (*)    All other components within normal limits  URINALYSIS, ROUTINE W REFLEX MICROSCOPIC - Abnormal; Notable for the following components:   APPearance HAZY (*)    Hgb urine dipstick SMALL (*)    Leukocytes,Ua SMALL (*)    All other components within normal limits  LIPASE, BLOOD  CBC  RAPID HIV SCREEN (HIV 1/2 AB+AG)  RPR  I-STAT BETA HCG BLOOD, ED ([facility], [facility], [facility] ONLY)  GC/CHLAMYDIA PROBE AMP ([place]) NOT AT [facility]    EKG None  Radiology No results found.  Procedures Procedures (including critical care  time)  Medications Ordered in ED Medications  HYDROcodone-acetaminophen (NORCO/VICODIN) 5-325 MG per tablet 1 tablet (1 tablet Oral Given 06/19/20 1432)  dicyclomine (BENTYL) capsule 10 mg (10 mg Oral Given 06/19/20 1432)    ED Course  I have reviewed the triage vital signs and the nursing notes.  Pertinent labs & imaging results that were available during my care of the patient were reviewed by me and considered in my medical decision making (see chart for details).  Clinical Course as of 06/19/20 1508  Sun Jun 19, 2020  1340 Chalmers GuestMarland Kitchen): SMALL [HK]  1340 Bacteria, UA: NONE SEEN [HK]  1340 WBC: 8.6 [HK]  1340 I-stat hCG, quantitative: <5.0 [HK]  0160 Patient would like to wait on STD testing prior to treatment. [HK]    Clinical Course User Index [HK] Delia Heady, PA-C   MDM Rules/Calculators/A&P                          29 year old female presenting to the ED for 1 week history of lower abdominal pain now radiating to her back.  She reports associated nausea and a large amount of vaginal discharge.  She is sexually active and concern for STDs and would like to be checked for the same.  She has an IUD which was placed 3 years ago but states that "it has not been checked on swatted off it still there."  Minimal improvement noted with Tums.  No changes to bowel movements or urination.  On exam there is tenderness of the right lower quadrant, suprapubic and right CVA tenderness.  No rebound or guarding.  Pelvic exam with significant amount of yellow malodorous discharge.  There is diffuse tenderness of bilateral adnexa and uterus without cervical motion tenderness.  I was able to visualize the IUD strings so I feel that this is correctly placed.  Lab work  including CMP, CBC, lipase unremarkable.  hCG is negative.  Urinalysis with small leukocytes but otherwise unremarkable. Patient will need pelvic u/s to rule out torsion or other structural cause of her persistent abdominal  pain. Will need to be treated for BV at the very least. Will dispo pending U/S results.   Portions of this note were generated with [employer]. Dictation errors may occur despite best attempts at proofreading.  Final Clinical Impression(s) / ED Diagnoses Final diagnoses:  Bacterial vaginosis  Concern about STD in female without diagnosis  Pelvic pain    Rx / DC Orders ED Discharge Orders         Ordered    metroNIDAZOLE (FLAGYL) 500 MG tablet  2 times daily        06/19/20 1411           Delia Heady, PA-C 06/19/20 1512    Horton, Alvin Critchley, DO 06/19/20 1532

## 2020-06-20 LAB — GC/CHLAMYDIA PROBE AMP (~~LOC~~) NOT AT ARMC
Chlamydia: POSITIVE — AB
Comment: NEGATIVE
Comment: NORMAL
Neisseria Gonorrhea: NEGATIVE

## 2020-06-20 LAB — RPR: RPR Ser Ql: NONREACTIVE

## 2020-06-22 ENCOUNTER — Ambulatory Visit: Payer: Self-pay | Admitting: *Deleted

## 2020-06-22 ENCOUNTER — Telehealth: Payer: Self-pay | Admitting: Medical

## 2020-06-22 DIAGNOSIS — A749 Chlamydial infection, unspecified: Secondary | ICD-10-CM

## 2020-06-22 MED ORDER — AZITHROMYCIN 250 MG PO TABS: 1000.0000 mg | ORAL_TABLET | Freq: Once | ORAL | 0 refills | Status: AC

## 2020-06-22 NOTE — Telephone Encounter (Signed)
Did not speak with pt.   Referred back to ED physician for lab work results due to no interpretation of several positive results.  Agent to transfer her to the ED.

## 2020-06-22 NOTE — Telephone Encounter (Signed)
-----   Message from Kathe Becton, RN sent at 06/22/2020  3:47 PM EST ----- This patient tested positive for :  chlamydia   She "has an allergy to Latex", I have informed the patient of her results and confirmed her pharmacy is correct in her chart. Please send Rx.   Thank you,   Kathe Becton, RN   Results faxed to [facility] Department.

## 2020-06-22 NOTE — Telephone Encounter (Signed)
Melinda Barnes tested positive for  Chlamydia. Patient was called by RN and allergies and pharmacy confirmed. Rx sent to pharmacy of choice.   Marny Lowenstein, PA-C 06/22/2020 5:09 PM

## 2020-09-13 ENCOUNTER — Other Ambulatory Visit: Payer: Self-pay

## 2020-09-13 ENCOUNTER — Encounter (HOSPITAL_COMMUNITY): Payer: Self-pay

## 2020-09-13 ENCOUNTER — Ambulatory Visit (HOSPITAL_COMMUNITY)
Admission: EM | Admit: 2020-09-13 | Discharge: 2020-09-13 | Disposition: A | Discharge status: Home / Self Care | Payer: Self-pay | Attending: Emergency Medicine | Admitting: Emergency Medicine

## 2020-09-13 DIAGNOSIS — B349 Viral infection, unspecified: Secondary | ICD-10-CM | POA: Insufficient documentation

## 2020-09-13 LAB — POCT RAPID STREP A, ED / UC: Streptococcus, Group A Screen (Direct): NEGATIVE

## 2020-09-13 NOTE — ED Triage Notes (Signed)
Pt presents with sore throat, fever, and generalized body aches for past few days.

## 2020-09-13 NOTE — ED Provider Notes (Signed)
[place]    CSN: [id number] Arrival date & time: 09/13/20  1900      History   Chief Complaint Chief Complaint  Patient presents with  . Sore Throat  . Generalized Body Aches    HPI Melinda Barnes is a 30 y.o. female.   Patient presents with 2-day history of body aches, fever, sore throat.  She also reports 2 episodes of emesis today.  She denies rash, cough, shortness of breath, diarrhea, or other symptoms.  No treatments attempted at home.  Her medical history includes migraine, fibroadenoma of breast, anemia.  The history is provided by the patient and medical records.    Past Medical History:  Diagnosis Date  . Medical history non-contributory     Patient Active Problem List   Diagnosis Date Noted  . Anemia affecting pregnancy 09/08/2015  . Indication for care in labor or delivery 09/07/2015  . Normal vaginal delivery 09/07/2015  . Bloody discharge from nipple 05/01/2015  . Trichomonas infection--dx 02/16/15, still present 04/21/15 05/01/2015  . 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 Migraine 03/31/2015  . 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 NO PAST SURGERIES      OB History    Gravida  3   Para  3   Term  3   Preterm      AB      Living  1     SAB      IAB      Ectopic      Multiple  0   Live Births  1            Home Medications    Prior to Admission medications   Medication Sig Start Date End Date Taking? Authorizing Provider  Biotin w/ Vitamins C & E (HAIR/SKIN/NAILS) 1250-7.5-7.5 MCG-MG-UNT CHEW Chew 2 tablets by mouth at bedtime.    [provider]  ferrous sulfate 325 (65 FE) MG tablet Take 1 tablet (325 mg total) by mouth 2 (two) times daily with a meal. Patient not taking: No sig reported 09/09/15   Standard, Venus, CNM  ibuprofen (ADVIL) 800 MG tablet Take 1 tablet (800 mg total) by mouth 3 (three) times daily. Patient not taking: No sig reported 11/20/19    Palumbo, April, MD  ibuprofen (ADVIL,MOTRIN) 600 MG tablet Take 1 tablet (600 mg total) by mouth every 6 (six) hours. Patient not taking: No sig reported 12/13/17   Antonietta Breach, PA-C  lidocaine (LIDODERM) 5 % Place 1 patch onto the skin daily. Remove & Discard patch within 12 hours or as directed by MD Patient not taking: No sig reported 11/20/19   Palumbo, April, MD  methocarbamol (ROBAXIN) 500 MG tablet Take 1 tablet (500 mg total) by mouth at bedtime as needed for muscle spasms. Patient not taking: No sig reported 12/14/17   Caccavale, Sophia, PA-C  metroNIDAZOLE (FLAGYL) 500 MG tablet Take 1 tablet (500 mg total) by mouth 2 (two) times daily. 06/19/20   Khatri, Hina, PA-C  ondansetron (ZOFRAN ODT) 4 MG disintegrating tablet Take 1 tablet (4 mg total) by mouth every 8 (eight) hours as needed for nausea or vomiting. Patient not taking: No sig reported 09/15/18   Tegeler, Gwenyth Allegra, MD  senna-docusate (SENOKOT-S) 8.6-50 MG tablet Take 2 tablets by mouth at bedtime as needed for mild constipation. Patient not taking: No sig reported 09/09/15   Standard, Venus, CNM    Family History Family History  Problem  Relation Age of Onset  . Asthma Mother     Social History Social History   Tobacco Use  . Smoking status: Former Smoker    Packs/day: 1.00    Years: 2.00    Pack years: 2.00    Types: Cigars    Quit date: 05/22/2008    Years since quitting: 12.3  . Smokeless tobacco: Never Used  Vaping Use  . Vaping Use: Never used  Substance Use Topics  . Alcohol use: No  . Drug use: No     Allergies   Latex   Review of Systems Review of Systems  Constitutional: Positive for fever. Negative for chills.  HENT: Positive for sore throat. Negative for ear pain.   Eyes: Negative for pain and visual disturbance.  Respiratory: Negative for cough and shortness of breath.   Cardiovascular: Negative for chest pain and palpitations.  Gastrointestinal: Positive for vomiting. Negative for  abdominal pain and diarrhea.  Genitourinary: Negative for dysuria and hematuria.  Musculoskeletal: Negative for arthralgias and back pain.  Skin: Negative for color change and rash.  Neurological: Negative for seizures and syncope.  All other systems reviewed and are negative.    Physical Exam Triage Vital Signs ED Triage Vitals  Enc Vitals Group     BP      Pulse      Resp      Temp      Temp src      SpO2      Weight      Height      Head Circumference      Peak Flow      Pain Score      Pain Loc      Pain Edu?      Excl. in [place]?    No data found.  Updated Vital Signs BP 130/81 (BP Location: Right Arm)   Pulse (!) 119   Temp (!) 100.6 F (38.1 C) (Oral)   Resp 17   SpO2 100%   Visual Acuity Right Eye Distance:   Left Eye Distance:   Bilateral Distance:    Right Eye Near:   Left Eye Near:    Bilateral Near:     Physical Exam Vitals and nursing note reviewed.  Constitutional:      General: She is not in acute distress.    Appearance: She is well-developed.  HENT:     Head: Normocephalic and atraumatic.     Right Ear: Tympanic membrane normal.     Left Ear: Tympanic membrane normal.     Nose: Nose normal.     Mouth/Throat:     Mouth: Mucous membranes are moist.     Pharynx: Posterior oropharyngeal erythema present.  Eyes:     Conjunctiva/sclera: Conjunctivae normal.  Cardiovascular:     Rate and Rhythm: Normal rate and regular rhythm.     Heart sounds: Normal heart sounds.  Pulmonary:     Effort: Pulmonary effort is normal. No respiratory distress.     Breath sounds: Normal breath sounds.  Abdominal:     General: Bowel sounds are normal.     Palpations: Abdomen is soft.     Tenderness: There is no abdominal tenderness. There is no guarding or rebound.  Musculoskeletal:     Cervical back: Neck supple.  Skin:    General: Skin is warm and dry.     Findings: No rash.  Neurological:     General: No focal deficit present.     Mental  Status: She  is alert and oriented to person, place, and time.     Gait: Gait normal.  Psychiatric:        Mood and Affect: Mood normal.        Behavior: Behavior normal.      UC Treatments / Results  Labs (all labs ordered are listed, but only abnormal results are displayed) Labs Reviewed  CULTURE, GROUP A STREP [facility])  POCT RAPID STREP A, ED / UC    EKG   Radiology No results found.  Procedures Procedures (including critical care time)  Medications Ordered in UC Medications - No data to display  Initial Impression / Assessment and Plan / UC Course  I have reviewed the triage vital signs and the nursing notes.  Pertinent labs & imaging results that were available during my care of the patient were reviewed by me and considered in my medical decision making (see chart for details).   Viral illness.  Patient declines COVID test or influenza test today.  Rapid strep negative; culture pending.  Discussed symptomatic treatment including Tylenol or ibuprofen as needed for fever or discomfort.  Instructed patient to rest and keep yourself hydrated.  Instructed her to follow-up with her PCP if her symptoms are not improving.  She agrees to plan of care.   Final Clinical Impressions(s) / UC Diagnoses   Final diagnoses:  Viral illness     Discharge Instructions     Your rapid strep test is negative.  A throat culture is pending; we will call you if it is positive requiring treatment.    Take Tylenol or ibuprofen as needed for fever or discomfort.    Follow up with your primary care provider if your symptoms are not improving.        ED Prescriptions    None     PDMP not reviewed this encounter.   Sharion Balloon, NP 09/13/20 2008

## 2020-09-13 NOTE — Discharge Instructions (Signed)
Your rapid strep test is negative.  A throat culture is pending; we will call you if it is positive requiring treatment.    Take Tylenol or ibuprofen as needed for fever or discomfort.     Follow-up with your primary care provider if your symptoms are not improving.      

## 2020-09-15 ENCOUNTER — Emergency Department (HOSPITAL_COMMUNITY)
Admission: EM | Admit: 2020-09-15 | Discharge: 2020-09-15 | Disposition: A | Discharge status: Home / Self Care | Payer: Self-pay | Attending: Emergency Medicine | Admitting: Emergency Medicine

## 2020-09-15 DIAGNOSIS — Z91040 Latex allergy status: Secondary | ICD-10-CM | POA: Insufficient documentation

## 2020-09-15 DIAGNOSIS — Z87891 Personal history of nicotine dependence: Secondary | ICD-10-CM | POA: Insufficient documentation

## 2020-09-15 DIAGNOSIS — R000 Tachycardia, unspecified: Secondary | ICD-10-CM | POA: Insufficient documentation

## 2020-09-15 DIAGNOSIS — J039 Acute tonsillitis, unspecified: Secondary | ICD-10-CM | POA: Insufficient documentation

## 2020-09-15 LAB — GROUP A STREP BY PCR: Group A Strep by PCR: NOT DETECTED

## 2020-09-15 MED ORDER — LACTATED RINGERS IV BOLUS
1000.0000 mL | Freq: Once | INTRAVENOUS | Status: AC
  Administered 2020-09-15: 1000 mL via INTRAVENOUS

## 2020-09-15 MED ORDER — KETOROLAC TROMETHAMINE 15 MG/ML IJ SOLN
15.0000 mg | Freq: Once | INTRAMUSCULAR | Status: AC
  Administered 2020-09-15: 15 mg via INTRAVENOUS
  Filled 2020-09-15: qty 1

## 2020-09-15 MED ORDER — PENICILLIN G BENZATHINE 1200000 UNIT/2ML IM SUSP: 1.2000 10*6.[IU] | Freq: Once | INTRAMUSCULAR | Status: DC

## 2020-09-15 MED ORDER — OXYCODONE HCL 5 MG/5ML PO SOLN: 5.0000 mg | Freq: Four times a day (QID) | ORAL | 0 refills | Status: DC | PRN

## 2020-09-15 MED ORDER — AMOXICILLIN-POT CLAVULANATE 400-57 MG/5ML PO SUSR: 500.0000 mg | Freq: Two times a day (BID) | ORAL | 0 refills | Status: AC

## 2020-09-15 MED ORDER — LIDOCAINE VISCOUS HCL 2 % MT SOLN: 15.0000 mL | Freq: Four times a day (QID) | OROMUCOSAL | 0 refills | Status: DC | PRN

## 2020-09-15 MED ORDER — DEXAMETHASONE SODIUM PHOSPHATE 10 MG/ML IJ SOLN
10.0000 mg | Freq: Once | INTRAMUSCULAR | Status: AC
  Administered 2020-09-15: 10 mg via INTRAVENOUS
  Filled 2020-09-15: qty 1

## 2020-09-15 MED ORDER — SODIUM CHLORIDE 0.9 % IV SOLN
3.0000 g | Freq: Once | INTRAVENOUS | Status: AC
  Administered 2020-09-15: 3 g via INTRAVENOUS
  Filled 2020-09-15: qty 8

## 2020-09-15 NOTE — ED Provider Notes (Signed)
[facility] EMERGENCY DEPARTMENT Provider Note   CSN: [id number] Arrival date & time: 09/15/20  1232     History No chief complaint on file.   Melinda Barnes is a 30 y.o. female.  HPI      Melinda Barnes is a 30 y.o. female, patient with no pertinent past medical history, presenting to the ED with sore throat for the last 4 days. Pain is sharp, bilateral, nonradiating, severe.  Accompanied by fever and body aches. Denies N/V/D, chest pain, shortness of breath, drooling, dental pain, headache, or any other complaints.     Past Medical History:  Diagnosis Date  . Medical history non-contributory     Patient Active Problem List   Diagnosis Date Noted  . Anemia affecting pregnancy 09/08/2015  . Indication for care in labor or delivery 09/07/2015  . Normal vaginal delivery 09/07/2015  . Bloody discharge from nipple 05/01/2015  . Trichomonas infection--dx 02/16/15, still present 04/21/15 05/01/2015  . 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 Migraine 03/31/2015  . 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 NO PAST SURGERIES       OB History    Gravida  3   Para  3   Term  3   Preterm      AB      Living  1     SAB      IAB      Ectopic      Multiple  0   Live Births  1           Family History  Problem Relation Age of Onset  . Asthma Mother     Social History   Tobacco Use  . Smoking status: Former Smoker    Packs/day: 1.00    Years: 2.00    Pack years: 2.00    Types: Cigars    Quit date: 05/22/2008    Years since quitting: 12.3  . Smokeless tobacco: Never Used  Vaping Use  . Vaping Use: Never used  Substance Use Topics  . Alcohol use: No  . Drug use: No    Home Medications Prior to Admission medications   Medication Sig Start Date End Date Taking? Authorizing Provider  oxyCODONE (ROXICODONE) 5 MG/5ML solution Take 5 mLs (5 mg total) by mouth every 6 (six) hours as needed for  severe pain. 09/15/20  Yes Mckynna Vanloan C, PA-C  Biotin w/ Vitamins C & E (HAIR/SKIN/NAILS) 1250-7.5-7.5 MCG-MG-UNT CHEW Chew 2 tablets by mouth at bedtime.    [provider]  ferrous sulfate 325 (65 FE) MG tablet Take 1 tablet (325 mg total) by mouth 2 (two) times daily with a meal. Patient not taking: No sig reported 09/09/15   Standard, Venus, CNM  ibuprofen (ADVIL) 800 MG tablet Take 1 tablet (800 mg total) by mouth 3 (three) times daily. Patient not taking: No sig reported 11/20/19   Palumbo, April, MD  ibuprofen (ADVIL,MOTRIN) 600 MG tablet Take 1 tablet (600 mg total) by mouth every 6 (six) hours. Patient not taking: No sig reported 12/13/17   Antonietta Breach, PA-C  lidocaine (LIDODERM) 5 % Place 1 patch onto the skin daily. Remove & Discard patch within 12 hours or as directed by MD Patient not taking: No sig reported 11/20/19   Palumbo, April, MD  methocarbamol (ROBAXIN) 500 MG tablet Take 1 tablet (500 mg total) by mouth at bedtime as needed for muscle spasms. Patient not taking: No sig  reported 12/14/17   Caccavale, Sophia, PA-C  metroNIDAZOLE (FLAGYL) 500 MG tablet Take 1 tablet (500 mg total) by mouth 2 (two) times daily. 06/19/20   Khatri, Hina, PA-C  ondansetron (ZOFRAN ODT) 4 MG disintegrating tablet Take 1 tablet (4 mg total) by mouth every 8 (eight) hours as needed for nausea or vomiting. Patient not taking: No sig reported 09/15/18   Tegeler, Gwenyth Allegra, MD  senna-docusate (SENOKOT-S) 8.6-50 MG tablet Take 2 tablets by mouth at bedtime as needed for mild constipation. Patient not taking: No sig reported 09/09/15   Standard, Venus, CNM    Allergies    Latex  Review of Systems   Review of Systems  Constitutional: Positive for fever.  HENT: Positive for sore throat. Negative for drooling and facial swelling.   Respiratory: Negative for cough and shortness of breath.   Cardiovascular: Negative for chest pain.  Gastrointestinal: Negative for abdominal pain, diarrhea, nausea  and vomiting.  Musculoskeletal: Negative for neck stiffness.  Neurological: Negative for dizziness, syncope, weakness and headaches.  All other systems reviewed and are negative.   Physical Exam Updated Vital Signs BP 119/80   Pulse 89   Temp (!) 100.5 F (38.1 C)   Resp 15   SpO2 98%   Physical Exam Vitals and nursing note reviewed.  Constitutional:      General: She is not in acute distress.    Appearance: She is well-developed. She is not diaphoretic.  HENT:     Head: Normocephalic and atraumatic.     Mouth/Throat:     Mouth: Mucous membranes are moist.     Pharynx: Oropharynx is clear.     Comments: Tonsillar swelling, equal bilaterally. Dentition appears to be intact and stable.   Patient is able to handle oral secretions without, though it is more comfortable for her to spit them out.  She is able to tolerate semireclined position without noted distress. Some trismus and limited mouth opening noted. No noted facial swelling.  No sublingual swelling or tongue elevation.  No swelling to the submental or submandibular regions.  No swelling or tenderness into the soft tissues of the neck. Eyes:     Conjunctiva/sclera: Conjunctivae normal.  Cardiovascular:     Rate and Rhythm: Normal rate and regular rhythm.     Pulses: Normal pulses.          Radial pulses are 2+ on the right side and 2+ on the left side.  Pulmonary:     Effort: Pulmonary effort is normal. No respiratory distress.     Breath sounds: Normal breath sounds.  Abdominal:     Palpations: Abdomen is soft.     Tenderness: There is no abdominal tenderness. There is no guarding.  Musculoskeletal:     Cervical back: Normal range of motion and neck supple.  Lymphadenopathy:     Cervical: Cervical adenopathy present.  Skin:    General: Skin is warm and dry.  Neurological:     Mental Status: She is alert.  Psychiatric:        Mood and Affect: Mood and affect normal.        Speech: Speech normal.         Behavior: Behavior normal.     ED Results / Procedures / Treatments   Labs (all labs ordered are listed, but only abnormal results are displayed) Labs Reviewed  GROUP A STREP BY PCR    EKG None  Radiology No results found.  Procedures Procedures   Medications Ordered in  ED Medications  ketorolac (TORADOL) 15 MG/ML injection 15 mg (has no administration in time range)  lactated ringers bolus 1,000 mL (has no administration in time range)  dexamethasone (DECADRON) injection 10 mg (has no administration in time range)  Ampicillin-Sulbactam (UNASYN) 3 g in sodium chloride 0.9 % 100 mL IVPB (has no administration in time range)    ED Course  I have reviewed the triage vital signs and the nursing notes.  Pertinent labs & imaging results that were available during my care of the patient were reviewed by me and considered in my medical decision making (see chart for details).  Clinical Course as of 09/15/20 1645  Thu Sep 15, 2020  1350 Patient not at the bed. [SJ]  1621 Group A Strep by PCR Negative, exam consistent with tonsillitis. Los suspicion for PTA, RPA [BH]    Clinical Course User Index [BH] Henderly, Britni A, PA-C [SJ] Gonsalo Cuthbertson C, PA-C   MDM Rules/Calculators/A&P                          Patient presents with bilateral sore throat along with swelling.  Febrile and mildly tachycardic.  Nontoxic-appearing.  Tolerating oral secretions.  End of shift patient care handoff report given to Britni Henderly, PA-C. Plan: Follow-up on ordered treatments.   Final Clinical Impression(s) / ED Diagnoses Final diagnoses:  Tonsillitis    Rx / DC Orders ED Discharge Orders         Ordered    oxyCODONE (ROXICODONE) 5 MG/5ML solution  Every 6 hours PRN        09/15/20 1643           Lorayne Bender, PA-C 09/15/20 1645    Charlesetta Shanks, MD 09/16/20 [phone]

## 2020-09-15 NOTE — ED Provider Notes (Signed)
Assumed from previous provider at shift change.  See note for full HPI  In summation 30 year old here for evaluation of sore throat over the last 4 days.  Pain is nonradiating, bilateral.  Has been having fevers and myalgias.  No recent Covid exposures concerns for Covid at this time.  On arrival patient with fever, appears dry.  Plan to follow-up on strep, reassessment after medications, likely DC   Physical Exam  BP 110/75 (BP Location: Left Arm)   Pulse (!) 103   Temp (!) 100.5 F (38.1 C)   Resp 18   SpO2 100%   Physical Exam Vitals and nursing note reviewed.  Constitutional:      General: She is not in acute distress.    Appearance: She is well-developed. She is not toxic-appearing or diaphoretic.  HENT:     Head: Normocephalic and atraumatic.     Jaw: There is normal jaw occlusion.     Comments: No trismus.  No submandibular induration or swelling    Nose: Nose normal.     Mouth/Throat:     Lips: Pink.     Mouth: Mucous membranes are dry.     Pharynx: Uvula midline.     Tonsils: Tonsillar exudate present. No tonsillar abscesses. 3+ on the right. 3+ on the left.     Comments: Posterior oropharynx with 3+ tonsils bilaterally with exudate.  Uvula midline.  No pooling of secretions.  No evidence of PTA or RPA.  Sublingual area soft Eyes:     Pupils: Pupils are equal, round, and reactive to light.  Neck:     Trachea: Phonation normal.     Comments: No neck stiffness or neck rigidity Cardiovascular:     Rate and Rhythm: Normal rate.  Pulmonary:     Effort: Pulmonary effort is normal. No respiratory distress.     Breath sounds: Normal breath sounds and air entry.  Abdominal:     General: There is no distension.  Musculoskeletal:        General: Normal range of motion.     Cervical back: Full passive range of motion without pain, normal range of motion and neck supple.  Skin:    General: Skin is warm and dry.     Capillary Refill: Capillary refill takes less than 2  seconds.     Comments: No edema, erythema or warmth  Neurological:     General: No focal deficit present.     Mental Status: She is alert.     Cranial Nerves: Cranial nerves are intact.     Sensory: Sensation is intact.     Motor: Motor function is intact.     ED Course/Procedures   Clinical Course as of 09/15/20 1850  Thu Sep 15, 2020  1350 Patient not at the bed. [SJ]  1621 Group A Strep by PCR Negative, exam consistent with tonsillitis. Los suspicion for PTA, RPA [BH]    Clinical Course User Index [BH] Henderly, Britni A, PA-C [SJ] Joy, Shawn C, PA-C    Procedures Labs Reviewed  GROUP A STREP BY PCR   MDM  Plan on FU after meds.  Strep negative  Declines Covid test  Feels improved after medications.  She is tolerating p.o. intake.  No pooling of secretions. Has bilateral tonsillar edema and exudate.  Uvula midline.  Sublingual area soft.  Tolerating secretions.  No evidence of PTA or RPA.  No neck rigidity or stiffness.  Low suspicion for meningitis, epiglottitis, deep space infection.  DC home with  pain management, antibiotics, close outpatient ENT follow-up.  She will return for any worsening symptoms.  The patient has been appropriately medically screened and/or stabilized in the ED. I have low suspicion for any other emergent medical condition which would require further screening, evaluation or treatment in the ED or require inpatient management.  Patient is hemodynamically stable and in no acute distress.  Patient able to ambulate in department prior to ED.  Evaluation does not show acute pathology that would require ongoing or additional emergent interventions while in the emergency department or further inpatient treatment.  I have discussed the diagnosis with the patient and answered all questions.  Pain is been managed while in the emergency department and patient has no further complaints prior to discharge.  Patient is comfortable with plan discussed in room  and is stable for discharge at this time.  I have discussed strict return precautions for returning to the emergency department.  Patient was encouraged to follow-up with PCP/specialist refer to at discharge.  Melinda Barnes was evaluated in Emergency Department on 09/15/2020 for the symptoms described in the history of present illness. She was evaluated in the context of the global COVID-19 pandemic, which necessitated consideration that the patient might be at risk for infection with the SARS-CoV-2 virus that causes COVID-19. Institutional protocols and algorithms that pertain to the evaluation of patients at risk for COVID-19 are in a state of rapid change based on information released by regulatory bodies including the CDC and federal and state organizations. These policies and algorithms were followed during the patient's care in the ED.      Henderly, Britni A, PA-C 09/15/20 1850    Melinda Chapel, MD 09/16/20 1949

## 2020-09-15 NOTE — Discharge Instructions (Addendum)
Take the antibiotics as prescribed.  Follow-up with ear nose and throat doctors.  I received the contact information of your discharge report  Return for new or worsening symptoms.

## 2020-09-15 NOTE — ED Triage Notes (Signed)
Pt back to the ED today with c/o sore throat , throat is red and very swollen , had a ned strept 2 days ago at [facility]

## 2020-09-16 ENCOUNTER — Other Ambulatory Visit: Payer: Self-pay

## 2020-09-16 DIAGNOSIS — N6310 Unspecified lump in the right breast, unspecified quadrant: Secondary | ICD-10-CM

## 2020-09-16 DIAGNOSIS — N6320 Unspecified lump in the left breast, unspecified quadrant: Secondary | ICD-10-CM

## 2020-09-16 LAB — CULTURE, GROUP A STREP (THRC)

## 2020-10-05 ENCOUNTER — Other Ambulatory Visit: Payer: Self-pay

## 2020-10-05 DIAGNOSIS — N6320 Unspecified lump in the left breast, unspecified quadrant: Secondary | ICD-10-CM

## 2020-10-05 DIAGNOSIS — N6310 Unspecified lump in the right breast, unspecified quadrant: Secondary | ICD-10-CM

## 2020-10-05 NOTE — Progress Notes (Signed)
Breast lump

## 2021-11-17 ENCOUNTER — Emergency Department (HOSPITAL_COMMUNITY)
Admission: EM | Admit: 2021-11-17 | Discharge: 2021-11-17 | Disposition: A | Discharge status: Home / Self Care | Payer: Self-pay | Attending: Emergency Medicine | Admitting: Emergency Medicine

## 2021-11-17 ENCOUNTER — Encounter (HOSPITAL_COMMUNITY): Payer: Self-pay | Admitting: Emergency Medicine

## 2021-11-17 ENCOUNTER — Emergency Department (HOSPITAL_COMMUNITY): Payer: Self-pay

## 2021-11-17 ENCOUNTER — Other Ambulatory Visit: Payer: Self-pay

## 2021-11-17 DIAGNOSIS — M25532 Pain in left wrist: Secondary | ICD-10-CM | POA: Insufficient documentation

## 2021-11-17 DIAGNOSIS — W2209XA Striking against other stationary object, initial encounter: Secondary | ICD-10-CM | POA: Insufficient documentation

## 2021-11-17 DIAGNOSIS — Z91040 Latex allergy status: Secondary | ICD-10-CM | POA: Insufficient documentation
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
## 2021-11-17 NOTE — ED Provider Notes (Signed)
[facility] EMERGENCY DEPARTMENT Provider Note   CSN: [id number] Arrival date & time: 11/17/21  1458     History  Chief Complaint  Patient presents with   Wrist Pain    Melinda Barnes is a 31 y.o. female.  Patient presents to the hospital complaining of left wrist pain after hitting her wrist against a dresser yesterday.  The patient is able to move her wrist and has no other complaints.  No relevant past medical history  HPI     Home Medications Prior to Admission medications   Medication Sig Start Date End Date Taking? Authorizing Provider  Biotin w/ Vitamins C & E (HAIR/SKIN/NAILS) 1250-7.5-7.5 MCG-MG-UNT CHEW Chew 2 tablets by mouth at bedtime.    [provider]  ferrous sulfate 325 (65 FE) MG tablet Take 1 tablet (325 mg total) by mouth 2 (two) times daily with a meal. Patient not taking: No sig reported 09/09/15   Standard, Venus, CNM  ibuprofen (ADVIL) 800 MG tablet Take 1 tablet (800 mg total) by mouth 3 (three) times daily. Patient not taking: No sig reported 11/20/19   Palumbo, April, MD  ibuprofen (ADVIL,MOTRIN) 600 MG tablet Take 1 tablet (600 mg total) by mouth every 6 (six) hours. Patient not taking: No sig reported 12/13/17   Antonietta Breach, PA-C  lidocaine (LIDODERM) 5 % Place 1 patch onto the skin daily. Remove & Discard patch within 12 hours or as directed by MD Patient not taking: No sig reported 11/20/19   Palumbo, April, MD  lidocaine (XYLOCAINE) 2 % solution Use as directed 15 mLs in the mouth or throat every 6 (six) hours as needed for mouth pain. 09/15/20   Henderly, Britni A, PA-C  methocarbamol (ROBAXIN) 500 MG tablet Take 1 tablet (500 mg total) by mouth at bedtime as needed for muscle spasms. Patient not taking: No sig reported 12/14/17   Caccavale, Sophia, PA-C  metroNIDAZOLE (FLAGYL) 500 MG tablet Take 1 tablet (500 mg total) by mouth 2 (two) times daily. 06/19/20   Khatri, Hina, PA-C  ondansetron (ZOFRAN ODT) 4 MG disintegrating  tablet Take 1 tablet (4 mg total) by mouth every 8 (eight) hours as needed for nausea or vomiting. Patient not taking: No sig reported 09/15/18   Tegeler, Gwenyth Allegra, MD  oxyCODONE (ROXICODONE) 5 MG/5ML solution Take 5 mLs (5 mg total) by mouth every 6 (six) hours as needed for severe pain. 09/15/20   Joy, Shawn C, PA-C  senna-docusate (SENOKOT-S) 8.6-50 MG tablet Take 2 tablets by mouth at bedtime as needed for mild constipation. Patient not taking: No sig reported 09/09/15   Standard, Venus, CNM      Allergies    Latex    Review of Systems   Review of Systems  Musculoskeletal:  Positive for arthralgias.   Physical Exam Updated Vital Signs BP 118/65 (BP Location: Right Arm)   Pulse 66   Temp 98.6 F (37 C) (Oral)   Resp 18   Ht '5\' 3"'$  (1.6 m)   Wt 69.9 kg   SpO2 100%   BMI 27.28 kg/m  Physical Exam Vitals and nursing note reviewed.  Constitutional:      General: She is not in acute distress.    Appearance: She is well-developed.  HENT:     Head: Normocephalic and atraumatic.  Eyes:     Conjunctiva/sclera: Conjunctivae normal.  Cardiovascular:     Rate and Rhythm: Normal rate.  Pulmonary:     Effort: Pulmonary effort is normal. No respiratory  distress.  Musculoskeletal:        General: Tenderness present. No swelling.     Cervical back: Neck supple.     Comments: Tenderness to palpation of the left wrist.  Patient is able to complete normal range of motion movements.  Strong radial pulse.  Brisk cap refill at fingertips.  No loss of sensation.  Skin:    General: Skin is warm and dry.     Capillary Refill: Capillary refill takes less than 2 seconds.  Neurological:     Mental Status: She is alert.  Psychiatric:        Mood and Affect: Mood normal.    ED Results / Procedures / Treatments   Labs (all labs ordered are listed, but only abnormal results are displayed) Labs Reviewed - No data to display  EKG None  Radiology DG Wrist Complete Left  Result Date:  11/17/2021 CLINICAL DATA:  Trauma, pain EXAM: LEFT WRIST - COMPLETE 3+ VIEW COMPARISON:  Left hand x-rays done on 10/15/2016 FINDINGS: There is no evidence of fracture or dislocation. There is no evidence of arthropathy or other focal bone abnormality. Soft tissues are unremarkable. IMPRESSION: No fracture or dislocation is seen in the left wrist. Electronically Signed   By: Elmer Picker M.D.   On: 11/17/2021 15:58    Procedures Procedures    Medications Ordered in ED Medications - No data to display  ED Course/ Medical Decision Making/ A&P                           Medical Decision Making  The patient presents with pain in the left wrist.  Differential includes but is not limited to fracture, dislocation, ligamentous injury, and others  I ordered plain x-rays of the left wrist.  I interpreted these personally.  No fracture or dislocation was noted.  I agree with the radiologist findings.  The patient likely has a sprain of the left wrist.  This will take time to heal.  She should rest the wrist, use ice as tolerated, use NSAIDs and Tylenol for pain relief.  There is no indication for admission.  I will provide follow-up information for hand surgery for the patient to follow-up if she does not experience improvement.   Final Clinical Impression(s) / ED Diagnoses Final diagnoses:  Left wrist pain    Rx / DC Orders ED Discharge Orders     None         Ronny Bacon 11/17/21 1803    Lacretia Leigh, MD 11/21/21 [phone]

## 2021-11-17 NOTE — Discharge Instructions (Signed)
You were seen today for wrist pain.  There were no signs of fracture on your x-ray.  I do recommend follow-up with orthopedics if you continue to have problems with the wrist.  Use ice, anti-inflammatory medicine, and Tylenol as needed for pain relief.  This will take some time to fully heal.

## 2022-08-29 IMAGING — CR DG WRIST COMPLETE 3+V*L*
4 series · 4 of 4 positions shown · non-contrast
Comparison: Left hand x-rays done on 10/15/2016

CLINICAL DATA: Trauma, pain

EXAM:
LEFT WRIST - COMPLETE 3+ VIEW

[wrist pa]
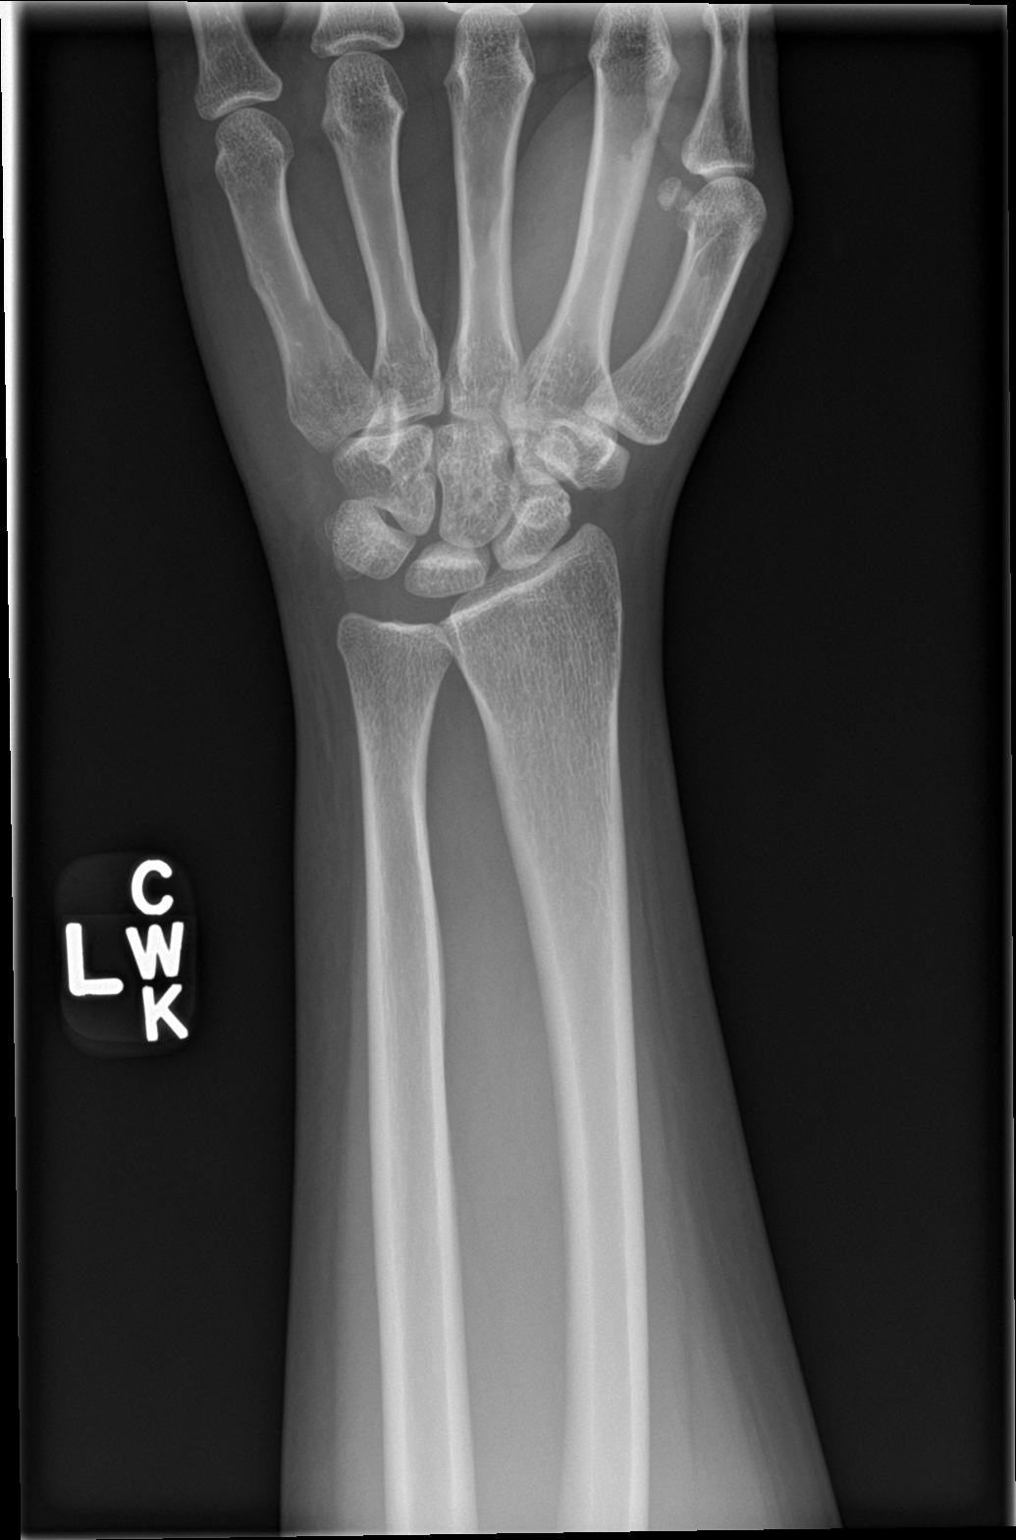

[wrist obl]
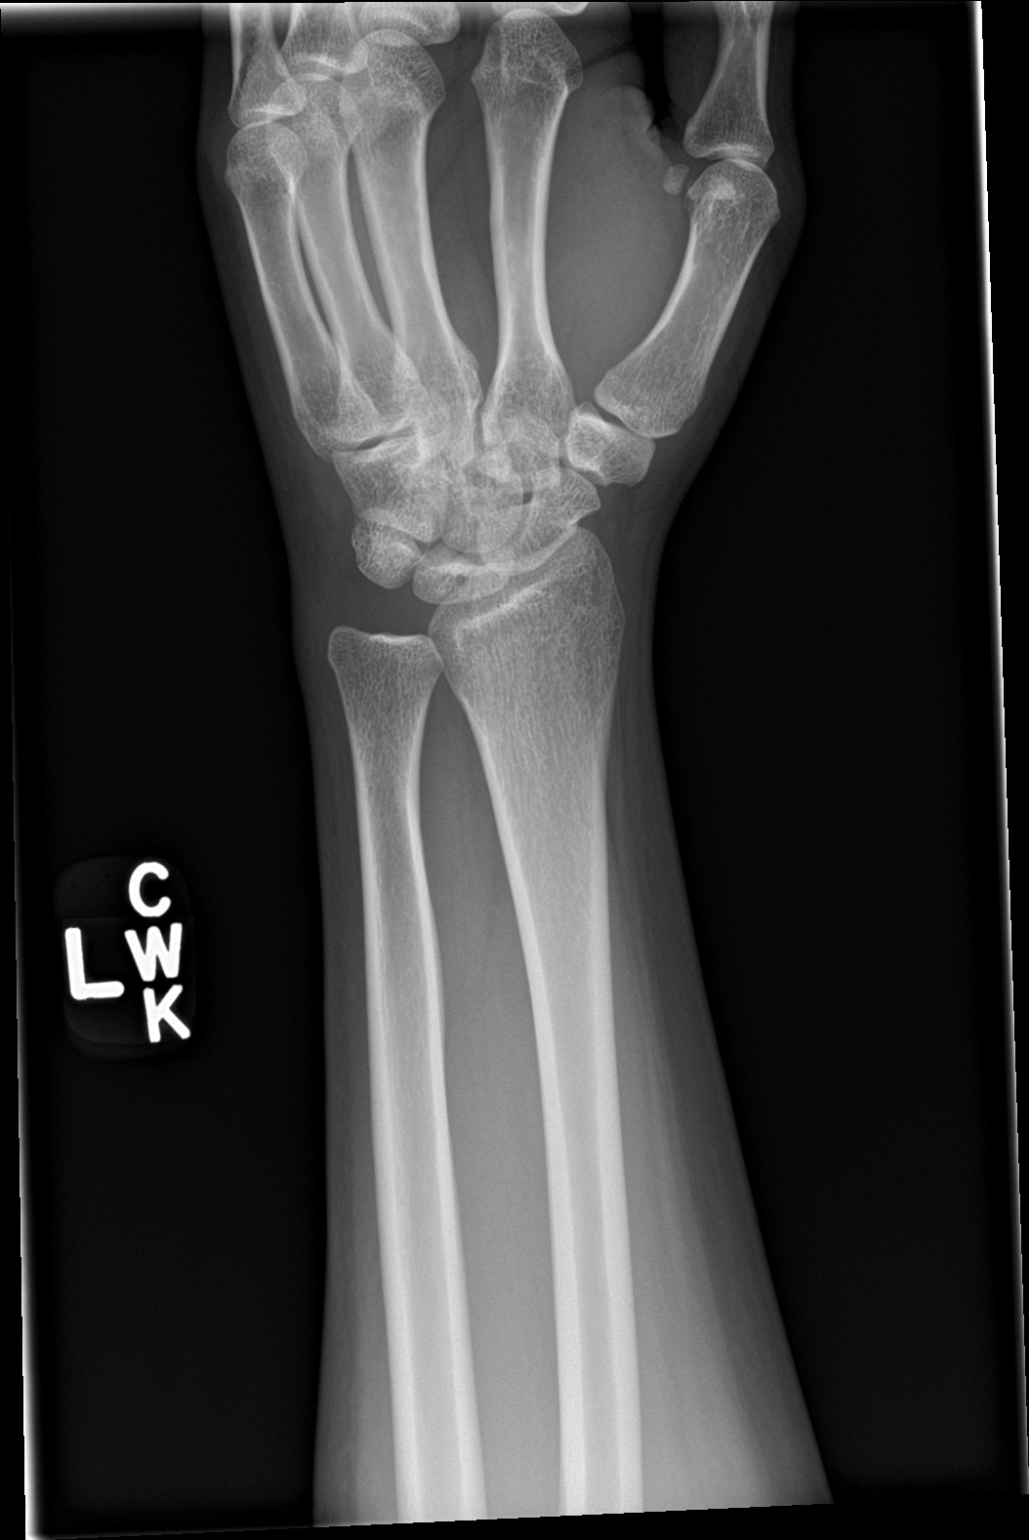

[wrist lat]
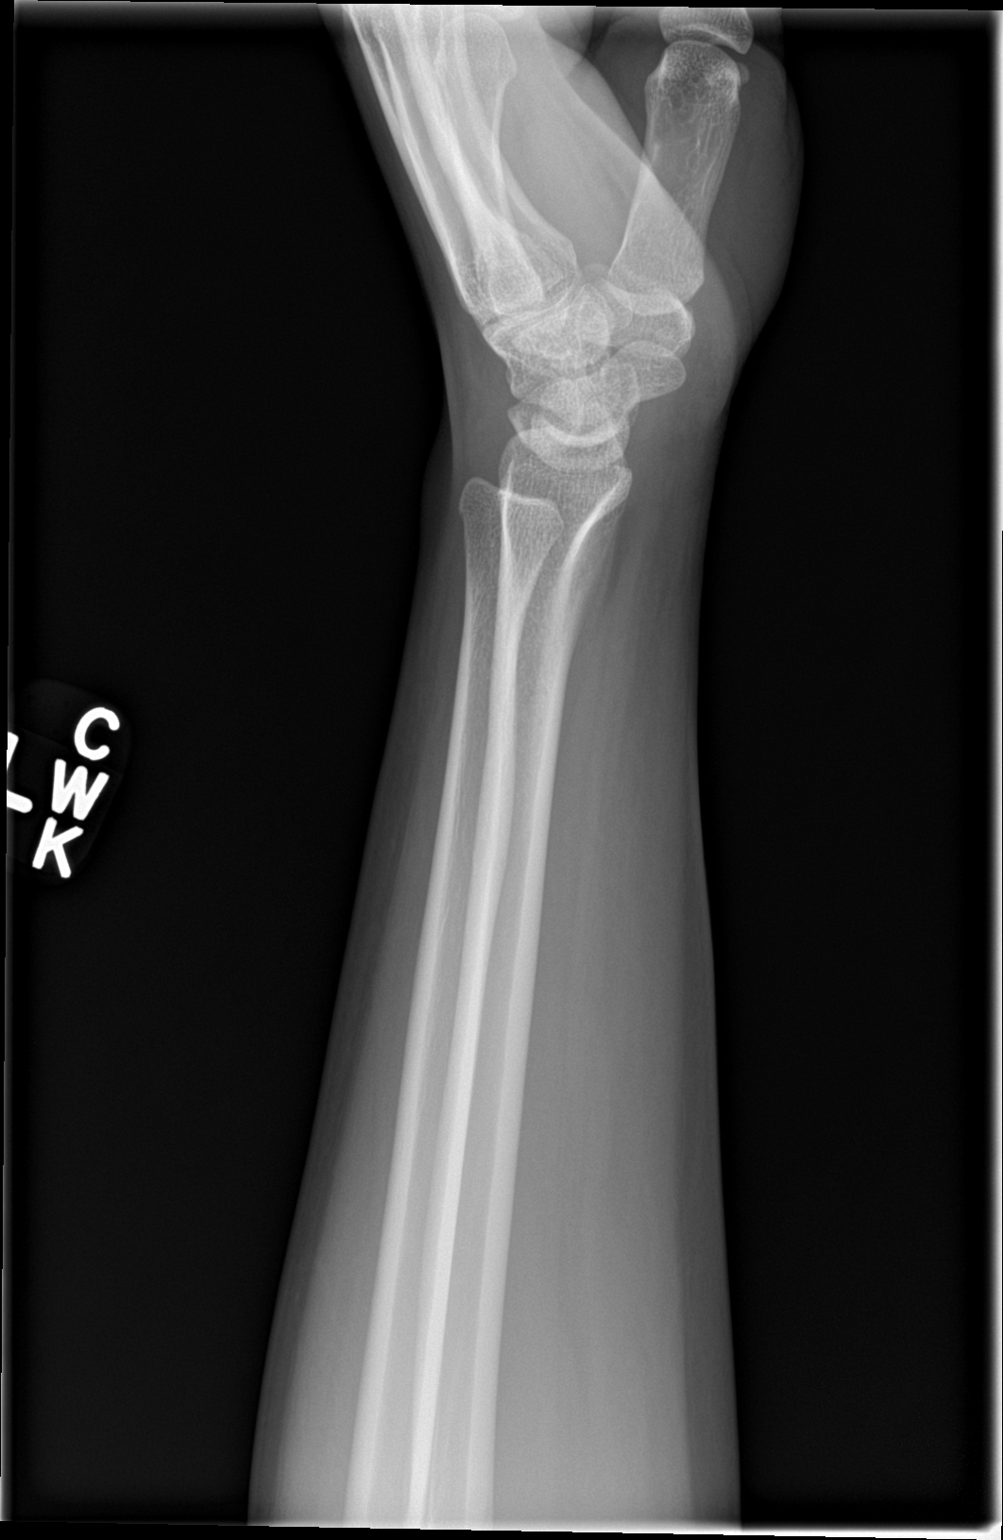

[wrist navicular]
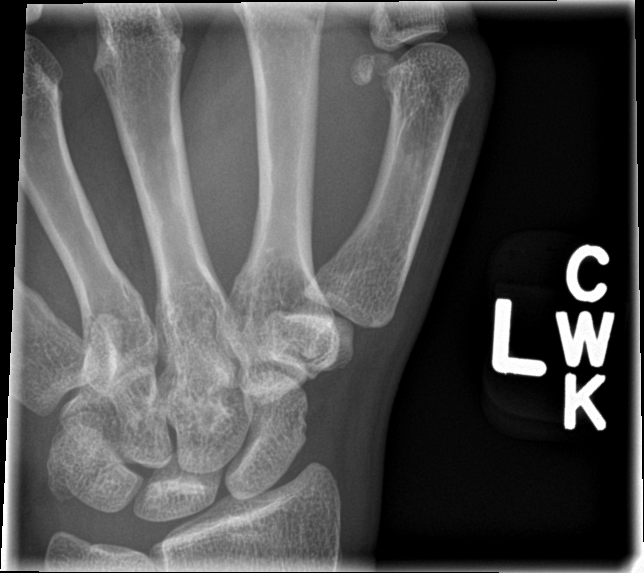

[4 of 4 positions shown; findings below may reference images not displayed]

FINDINGS: There is no evidence of fracture or dislocation. There is no
evidence of arthropathy or other focal bone abnormality. Soft
tissues are unremarkable.
IMPRESSION: No fracture or dislocation is seen in the left wrist.

## 2023-03-29 ENCOUNTER — Encounter (HOSPITAL_COMMUNITY): Payer: Self-pay

## 2023-03-29 ENCOUNTER — Emergency Department (HOSPITAL_COMMUNITY)
Admission: EM | Admit: 2023-03-29 | Discharge: 2023-03-29 | Disposition: A | Discharge status: Home / Self Care | Payer: Self-pay | Attending: Emergency Medicine | Admitting: Emergency Medicine

## 2023-03-29 DIAGNOSIS — K0889 Other specified disorders of teeth and supporting structures: Secondary | ICD-10-CM | POA: Insufficient documentation

## 2023-03-29 DIAGNOSIS — Z91040 Latex allergy status: Secondary | ICD-10-CM | POA: Insufficient documentation

## 2023-03-29 MED ORDER — KETOROLAC TROMETHAMINE 15 MG/ML IJ SOLN
15.0000 mg | Freq: Once | INTRAMUSCULAR | Status: AC
  Administered 2023-03-29: 15 mg via INTRAMUSCULAR
  Filled 2023-03-29: qty 1

## 2023-03-29 NOTE — Discharge Instructions (Signed)
You were seen tonight for dental pain.  It is important that you schedule an appointment with a dentist for definitive management.  You may take over-the-counter ibuprofen, up to 600 mg every 6 hours.  You may also take over-the-counter Tylenol, no more than 1000 mg every 6 hours.  Do not exceed 4000 mg of Tylenol in a 24 hour period

## 2023-03-29 NOTE — ED Triage Notes (Signed)
Pt arrived POV with c/o front lower dental pain, no cavities, chip, or broken tooth. Pt has not seen the dentist. VSS, afebrile, NAD noted.

## 2023-03-29 NOTE — ED Provider Notes (Signed)
[facility] EMERGENCY DEPARTMENT AT [facility] Provider Note   CSN: [id number] Arrival date & time: 03/29/23  0409     History  Chief Complaint  Patient presents with   Dental Pain    Melinda Barnes is a 32 y.o. female.  Patient presents the emergency room complaining of dental pain.  Patient states her front right lower incisor has been painful for a few days.  She denies any known injury.  States pain is worse with pressure including brushing teeth.  She denies any hot or cold sensitivity.  Denies any fevers, nausea, vomiting.  Patient has not seen a dentist and has no dentist currently.  Past medical history noncontributory   Dental Pain      Home Medications Prior to Admission medications   Medication Sig Start Date End Date Taking? Authorizing Provider  Biotin w/ Vitamins C & E (HAIR/SKIN/NAILS) 1250-7.5-7.5 MCG-MG-UNT CHEW Chew 2 tablets by mouth at bedtime.    [provider]  ferrous sulfate 325 (65 FE) MG tablet Take 1 tablet (325 mg total) by mouth 2 (two) times daily with a meal. Patient not taking: No sig reported 09/09/15   Standard, Venus, CNM  ibuprofen (ADVIL) 800 MG tablet Take 1 tablet (800 mg total) by mouth 3 (three) times daily. Patient not taking: No sig reported 11/20/19   Palumbo, April, MD  ibuprofen (ADVIL,MOTRIN) 600 MG tablet Take 1 tablet (600 mg total) by mouth every 6 (six) hours. Patient not taking: No sig reported 12/13/17   Antony Madura, PA-C  lidocaine (LIDODERM) 5 % Place 1 patch onto the skin daily. Remove & Discard patch within 12 hours or as directed by MD Patient not taking: No sig reported 11/20/19   Palumbo, April, MD  lidocaine (XYLOCAINE) 2 % solution Use as directed 15 mLs in the mouth or throat every 6 (six) hours as needed for mouth pain. 09/15/20   Henderly, Britni A, PA-C  methocarbamol (ROBAXIN) 500 MG tablet Take 1 tablet (500 mg total) by mouth at bedtime as needed for muscle spasms. Patient not taking: No sig  reported 12/14/17   Caccavale, Sophia, PA-C  metroNIDAZOLE (FLAGYL) 500 MG tablet Take 1 tablet (500 mg total) by mouth 2 (two) times daily. 06/19/20   Khatri, Hina, PA-C  ondansetron (ZOFRAN ODT) 4 MG disintegrating tablet Take 1 tablet (4 mg total) by mouth every 8 (eight) hours as needed for nausea or vomiting. Patient not taking: No sig reported 09/15/18   Tegeler, Canary Brim, MD  oxyCODONE (ROXICODONE) 5 MG/5ML solution Take 5 mLs (5 mg total) by mouth every 6 (six) hours as needed for severe pain. 09/15/20   Joy, Shawn C, PA-C  senna-docusate (SENOKOT-S) 8.6-50 MG tablet Take 2 tablets by mouth at bedtime as needed for mild constipation. Patient not taking: No sig reported 09/09/15   Standard, Venus, CNM      Allergies    Latex    Review of Systems   Review of Systems  Physical Exam Updated Vital Signs BP 123/85   Pulse 83   Temp 98.2 F (36.8 C)   Resp 16   Ht 5\' 3"  (1.6 m)   Wt 71.2 kg   LMP  (LMP Unknown) Comment: 7 years ago (2024)  SpO2 100%   BMI 27.81 kg/m  Physical Exam Vitals and nursing note reviewed.  HENT:     Head: Normocephalic and atraumatic.     Mouth/Throat:   Eyes:     Pupils: Pupils are equal, round, and  reactive to light.  Pulmonary:     Effort: Pulmonary effort is normal. No respiratory distress.  Musculoskeletal:        General: No signs of injury.     Cervical back: Normal range of motion.  Skin:    General: Skin is dry.  Neurological:     Mental Status: She is alert.  Psychiatric:        Speech: Speech normal.        Behavior: Behavior normal.     ED Results / Procedures / Treatments   Labs (all labs ordered are listed, but only abnormal results are displayed) Labs Reviewed - No data to display  EKG None  Radiology No results found.  Procedures Procedures    Medications Ordered in ED Medications  ketorolac (TORADOL) 15 MG/ML injection 15 mg (has no administration in time range)    ED Course/ Medical Decision Making/  A&P                                 Medical Decision Making Risk Prescription drug management.   This patient presents to the ED for concern of dental pain, this involves an extensive number of treatment options, and is a complaint that carries with it a high risk of complications and morbidity.  The differential diagnosis includes dental cavity, abscess, other infection, fractured tooth, others   Co morbidities that complicate the patient evaluation  None   Problem List / ED Course / Critical interventions / Medication management   I ordered medication including Toradol for pain  Reevaluation of the patient after these medicines showed that the patient improved I have reviewed the patients home medicines and have made adjustments as needed   Social Determinants of Health:  Patient has no health insurance   Test / Admission - Considered:  No signs of infection or dental injury at this time.  Patient's pain treated with Toradol.  Patient told that she needs to follow-up with dentistry for definitive management of her dental pain.  Patient voices understanding.  Patient will use over-the-counter ibuprofen and Tylenol for pain.  Dental resources provided.         Final Clinical Impression(s) / ED Diagnoses Final diagnoses:  Pain, dental    Rx / DC Orders ED Discharge Orders     None         Pamala Duffel 03/29/23 0454    Shon Baton, MD 03/29/23 [phone]

## 2023-07-23 ENCOUNTER — Other Ambulatory Visit: Payer: Self-pay

## 2023-07-23 ENCOUNTER — Ambulatory Visit (HOSPITAL_COMMUNITY)
Admission: EM | Admit: 2023-07-23 | Discharge: 2023-07-23 | Disposition: A | Discharge status: Home / Self Care | Payer: Self-pay | Attending: Physician Assistant | Admitting: Physician Assistant

## 2023-07-23 ENCOUNTER — Encounter (HOSPITAL_COMMUNITY): Payer: Self-pay | Admitting: *Deleted

## 2023-07-23 DIAGNOSIS — M25512 Pain in left shoulder: Secondary | ICD-10-CM | POA: Insufficient documentation

## 2023-07-23 DIAGNOSIS — J029 Acute pharyngitis, unspecified: Secondary | ICD-10-CM | POA: Insufficient documentation

## 2023-07-23 LAB — POCT RAPID STREP A (OFFICE): Rapid Strep A Screen: NEGATIVE

## 2023-07-23 MED ORDER — PREDNISONE 20 MG PO TABS: ORAL_TABLET | ORAL | 0 refills | Status: DC

## 2023-07-23 NOTE — ED Provider Notes (Signed)
[facility]    CSN: [id number] Arrival date & time: 07/23/23  1120      History   Chief Complaint Chief Complaint  Patient presents with   Back Pain   Sore Throat    HPI Melinda Barnes is a 33 y.o. female.   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 She denies fever, chills, coughing or nasal congestion     Past Medical History:  Diagnosis Date   Medical history non-contributory     Patient Active Problem List   Diagnosis Date Noted   Anemia affecting pregnancy 09/08/2015   Indication for care in labor or delivery 09/07/2015   Normal vaginal delivery 09/07/2015   Bloody discharge from nipple 05/01/2015   Trichomonas infection--dx 02/16/15, still present 04/21/15 05/01/2015   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 Authorizing Provider  predniSONE (DELTASONE) 20 MG tablet Take 60mg  PO daily x 2 days, then40mg  PO daily x 2 days, then 20mg  PO daily x 3 days 07/23/23  Yes Shelah Heatley E, PA-C  Biotin w/ Vitamins C & E (HAIR/SKIN/NAILS) 1250-7.5-7.5 MCG-MG-UNT CHEW Chew 2 tablets by mouth at bedtime.    [provider]  ferrous sulfate 325 (65 FE) MG  tablet Take 1 tablet (325 mg total) by mouth 2 (two) times daily with a meal. Patient not taking: Reported on 09/28/2016 09/09/15   Standard, Venus, CNM  ibuprofen (ADVIL) 800 MG tablet Take 1 tablet (800 mg total) by mouth 3 (three) times daily. Patient not taking: Reported on 06/19/2020 11/20/19   Palumbo, April, MD  ibuprofen (ADVIL,MOTRIN) 600 MG tablet Take 1 tablet (600 mg total) by mouth every 6 (six) hours. Patient not taking: Reported on 09/15/2018 12/13/17   Antony Madura, PA-C  lidocaine (LIDODERM) 5 % Place 1 patch onto the skin daily. Remove & Discard patch within 12 hours or as directed by MD Patient not taking: Reported on 06/19/2020 11/20/19   Palumbo, April, MD  lidocaine (XYLOCAINE) 2 % solution Use as directed 15 mLs in the mouth or throat every 6 (six) hours as needed for mouth pain. 09/15/20   Henderly, Britni A, PA-C  methocarbamol (ROBAXIN) 500 MG tablet Take 1 tablet (500 mg total) by mouth at bedtime as needed for muscle spasms. Patient not taking: No sig reported 12/14/17   Caccavale, Sophia,  PA-C  metroNIDAZOLE (FLAGYL) 500 MG tablet Take 1 tablet (500 mg total) by mouth 2 (two) times daily. 06/19/20   Khatri, Hina, PA-C  ondansetron (ZOFRAN ODT) 4 MG disintegrating tablet Take 1 tablet (4 mg total) by mouth every 8 (eight) hours as needed for nausea or vomiting. Patient not taking: Reported on 06/19/2020 09/15/18   Tegeler, Canary Brim, MD  oxyCODONE (ROXICODONE) 5 MG/5ML solution Take 5 mLs (5 mg total) by mouth every 6 (six) hours as needed for severe pain. 09/15/20   Joy, Shawn C, PA-C  senna-docusate (SENOKOT-S) 8.6-50 MG tablet Take 2 tablets by mouth at bedtime as needed for mild constipation. Patient not taking: Reported on 09/28/2016 09/09/15   Standard, Venus, CNM    Family History Family History  Problem Relation Age of Onset   Asthma Mother     Social History Social History   Tobacco Use   Smoking status: Former    Current packs/day: 0.00    Average packs/day:  1 pack/day for 2.0 years (2.0 ttl pk-yrs)    Types: Cigars, Cigarettes    Start date: 05/22/2006    Quit date: 05/22/2008    Years since quitting: 15.1   Smokeless tobacco: Never  Vaping Use   Vaping status: Every Day   Substances: Nicotine, Flavoring  Substance Use Topics   Alcohol use: Not Currently   Drug use: No     Allergies   Latex   Review of Systems Review of Systems  Constitutional:  Negative for chills and fever.  HENT:  Positive for sore throat. Negative for congestion, ear pain and postnasal drip.   Respiratory:  Negative for cough, shortness of breath and wheezing.   Gastrointestinal:  Negative for diarrhea, nausea and vomiting.  Musculoskeletal:  Positive for back pain.  Neurological:  Negative for weakness and numbness.     Physical Exam Triage Vital Signs ED Triage Vitals  Encounter Vitals Group     BP 07/23/23 1236 108/71     Systolic BP Percentile --      Diastolic BP Percentile --      Pulse Rate 07/23/23 1236 (!) 57     Resp 07/23/23 1236 18     Temp 07/23/23 1236 98.9 F (37.2 C)     Temp src --      SpO2 07/23/23 1236 97 %     Weight --      Height --      Head Circumference --      Peak Flow --      Pain Score 07/23/23 1233 8     Pain Loc --      Pain Education --      Exclude from Growth Chart --    No data found.  Updated Vital Signs BP 108/71   Pulse (!) 57   Temp 98.9 F (37.2 C)   Resp 18   SpO2 97%   Visual Acuity Right Eye Distance:   Left Eye Distance:   Bilateral Distance:    Right Eye Near:   Left Eye Near:    Bilateral Near:     Physical Exam Vitals reviewed.  Constitutional:      General: She is awake.     Appearance: Normal appearance. She is well-developed.  HENT:     Head: Normocephalic and atraumatic.     Right Ear: Hearing, tympanic membrane and ear canal normal.     Left Ear: Hearing, tympanic membrane and ear canal normal.     Mouth/Throat:  Lips: Pink.     Mouth: Mucous membranes are moist.      Pharynx: Oropharynx is clear. Uvula midline. Posterior oropharyngeal erythema present. No postnasal drip.     Tonsils: Tonsillar exudate present. No tonsillar abscesses. 1+ on the right. 2+ on the left.  Cardiovascular:     Rate and Rhythm: Normal rate and regular rhythm.     Heart sounds: Normal heart sounds.  Pulmonary:     Effort: Pulmonary effort is normal.     Breath sounds: Normal breath sounds. No decreased air movement. No decreased breath sounds, wheezing, rhonchi or rales.  Musculoskeletal:     Left shoulder: Tenderness present. Normal range of motion. Normal strength. Normal pulse.       Arms:     Cervical back: Normal range of motion and neck supple. Normal range of motion.     Comments: Shoulder ROM: internal and external rotation are overall intact and symmetrical. Flexion, extension and abduction are intact and symmetrical  Patient is hesitant to move left arm and shoulder but ROM is overall intact and strength is 5/5   Neurological:     Mental Status: She is alert.  Psychiatric:        Mood and Affect: Mood normal.        Behavior: Behavior normal. Behavior is cooperative.      UC Treatments / Results  Labs (all labs ordered are listed, but only abnormal results are displayed) Labs Reviewed  CULTURE, GROUP A STREP [facility])  POCT RAPID STREP A (OFFICE)    EKG   Radiology No results found.  Procedures Procedures (including critical care time)  Medications Ordered in UC Medications - No data to display  Initial Impression / Assessment and Plan / UC Course  I have reviewed the triage vital signs and the nursing notes.  Pertinent labs & imaging results that were available during my care of the patient were reviewed by me and considered in my medical decision making (see chart for details).      Final Clinical Impressions(s) / UC Diagnoses   Final diagnoses:  Sore throat  Acute pain of left shoulder    Sore throat Acute, new concern x1  day Patient reports sore throat without improvement. Physical exam is notable for enlarged tonsils and mild exudate. Rapid strep was negative.Will get culture for definitive rule out. Results to dictate further management    Back/shoulder pain Suspect muscular etiology given level of pain and likely exacerbated by reduced use Recommend conservative measures at this time Will start Prednisone taper to assist with tonsils which should also provide relief to MSK inflammation along thoracic spine Reviewed she should not use NSAIDs while taking Prednisone but can use this after completing course      Discharge Instructions      We will keep you updated on the results of your Strep culture once it is back For now I recommend taking the prednisone to assist with the inflammation in your tonsils and your back You can use Tylenol as needed for pain. After you finish the Prednisone you can use Ibuprofen as needed for lingering back/shoulder pain You can use warm compresses, massage and gentle stretches to assist with further pain relief of the shoulder area.       ED Prescriptions     Medication Sig Dispense Auth. Provider   predniSONE (DELTASONE) 20 MG tablet Take 60mg  PO daily x 2 days, then40mg  PO daily x 2 days, then 20mg  PO daily x 3 days  13 tablet [employer] E, PA-C      PDMP not reviewed this encounter.   Providence Crosby, PA-C 07/23/23 1355

## 2023-07-23 NOTE — Discharge Instructions (Signed)
We will keep you updated on the results of your Strep culture once it is back For now I recommend taking the prednisone to assist with the inflammation in your tonsils and your back You can use Tylenol as needed for pain. After you finish the Prednisone you can use Ibuprofen as needed for lingering back/shoulder pain You can use warm compresses, massage and gentle stretches to assist with further pain relief of the shoulder area.

## 2023-07-23 NOTE — ED Triage Notes (Signed)
Pt reports back pain for one week ans sore throat.

## 2023-07-26 LAB — CULTURE, GROUP A STREP (THRC)

## 2023-09-09 ENCOUNTER — Other Ambulatory Visit: Payer: Self-pay

## 2023-09-09 ENCOUNTER — Emergency Department (HOSPITAL_COMMUNITY): Payer: Self-pay

## 2023-09-09 ENCOUNTER — Emergency Department (HOSPITAL_COMMUNITY)
Admission: EM | Admit: 2023-09-09 | Discharge: 2023-09-09 | Disposition: A | Discharge status: Home / Self Care | Payer: Self-pay | Attending: Emergency Medicine | Admitting: Emergency Medicine

## 2023-09-09 DIAGNOSIS — S39012A Strain of muscle, fascia and tendon of lower back, initial encounter: Secondary | ICD-10-CM | POA: Insufficient documentation

## 2023-09-09 DIAGNOSIS — X500XXA Overexertion from strenuous movement or load, initial encounter: Secondary | ICD-10-CM | POA: Insufficient documentation

## 2023-09-09 DIAGNOSIS — Z91040 Latex allergy status: Secondary | ICD-10-CM | POA: Insufficient documentation

## 2023-09-09 DIAGNOSIS — Y990 Civilian activity done for income or pay: Secondary | ICD-10-CM | POA: Insufficient documentation

## 2023-09-09 MED ORDER — CYCLOBENZAPRINE HCL 10 MG PO TABS: 10.0000 mg | ORAL_TABLET | Freq: Two times a day (BID) | ORAL | 0 refills | Status: DC | PRN

## 2023-09-09 MED ORDER — NAPROXEN 375 MG PO TABS: 375.0000 mg | ORAL_TABLET | Freq: Two times a day (BID) | ORAL | 0 refills | Status: DC

## 2023-09-09 NOTE — ED Triage Notes (Signed)
 Pt arrived via POV. C/o thoracic back pain that started 4x days ago. Their back locked up in the shower this AM.  Has not taken any OTC meds for the pain

## 2023-09-09 NOTE — ED Provider Notes (Signed)
 [place] EMERGENCY DEPARTMENT AT [facility] Provider Note   CSN: [id number] Arrival date & time: 09/09/23  1437     History  Chief Complaint  Patient presents with   Back Pain    Melinda Barnes is a 33 y.o. female.  Patient is a 32 year old female who presents with back pain.  She has had a 4 to 5-day history of pain in her right upper back.  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  There is no radiation of the symptoms.  She says it is worse when she tries to bend over or twist.  No numbness or weakness to her extremities.  No chest pain or shortness of breath.  No pleuritic symptoms.  No leg pain or swelling.  No cough or cold symptoms.  No fevers.  No urinary symptoms.       Home Medications Prior to Admission medications   Medication Sig Start Date End Date Taking? Authorizing Provider  cyclobenzaprine (FLEXERIL) 10 MG tablet Take 1 tablet (10 mg total) by mouth 2 (two) times daily as needed for muscle spasms. 09/09/23  Yes Rolan Bucco, MD  naproxen (NAPROSYN) 375 MG tablet Take 1 tablet (375 mg total) by mouth 2 (two) times daily. 09/09/23  Yes Rolan Bucco, MD  Biotin w/ Vitamins C & E (HAIR/SKIN/NAILS) 1250-7.5-7.5 MCG-MG-UNT CHEW Chew 2 tablets by mouth at bedtime.    [provider]  ferrous sulfate 325 (65 FE) MG tablet Take 1 tablet (325 mg total) by mouth 2 (two) times daily with a meal. Patient not taking: Reported on 09/28/2016 09/09/15   Standard, Venus, CNM  lidocaine (LIDODERM) 5 % Place 1 patch onto the skin daily. Remove & Discard patch within 12 hours or as directed by MD Patient not taking: Reported on 06/19/2020 11/20/19   Palumbo, April, MD  lidocaine (XYLOCAINE) 2 % solution Use as directed 15 mLs in the mouth or throat every 6 (six) hours as needed for mouth pain. 09/15/20   Henderly, Britni A, PA-C  metroNIDAZOLE (FLAGYL) 500 MG tablet Take 1 tablet (500  mg total) by mouth 2 (two) times daily. 06/19/20   Khatri, Hina, PA-C  ondansetron (ZOFRAN ODT) 4 MG disintegrating tablet Take 1 tablet (4 mg total) by mouth every 8 (eight) hours as needed for nausea or vomiting. Patient not taking: Reported on 06/19/2020 09/15/18   Tegeler, Canary Brim, MD  oxyCODONE (ROXICODONE) 5 MG/5ML solution Take 5 mLs (5 mg total) by mouth every 6 (six) hours as needed for severe pain. 09/15/20   Joy, Shawn C, PA-C  predniSONE (DELTASONE) 20 MG tablet Take 60mg  PO daily x 2 days, then40mg  PO daily x 2 days, then 20mg  PO daily x 3 days 07/23/23   Mecum, Erin E, PA-C  senna-docusate (SENOKOT-S) 8.6-50 MG tablet Take 2 tablets by mouth at bedtime as needed for mild constipation. Patient not taking: Reported on 09/28/2016 09/09/15   Standard, Venus, CNM      Allergies    Latex    Review of Systems   Review of Systems  Constitutional:  Negative for fever.  Respiratory:  Negative for cough and shortness of breath.   Cardiovascular:  Negative for chest pain.  Gastrointestinal:  Negative for nausea and vomiting.  Genitourinary:  Negative for dysuria, flank pain and frequency.  Musculoskeletal:  Positive for back pain. Negative for arthralgias, joint swelling and neck pain.  Skin:  Negative for wound.  Neurological:  Negative for weakness, numbness and headaches.    Physical Exam Updated Vital Signs BP 127/80 (BP Location: Left Arm)   Pulse 63   Temp 98.9 F (37.2 C) (Oral)   Resp 16   Ht 5\' 3"  (1.6 m)   Wt 70.3 kg   SpO2 100%   BMI 27.46 kg/m  Physical Exam Constitutional:      Appearance: She is well-developed.  HENT:     Head: Normocephalic and atraumatic.  Eyes:     Pupils: Pupils are equal, round, and reactive to light.  Cardiovascular:     Rate and Rhythm: Normal rate and regular rhythm.     Heart sounds: Normal heart sounds.  Pulmonary:     Effort: Pulmonary effort is normal. No respiratory distress.     Breath sounds: Normal breath sounds. No  wheezing or rales.  Chest:     Chest wall: No tenderness.  Abdominal:     General: Bowel sounds are normal.     Palpations: Abdomen is soft.     Tenderness: There is no abdominal tenderness. There is no guarding or rebound.  Musculoskeletal:        General: Normal range of motion.     Cervical back: Normal range of motion and neck supple.     Comments: Positive tenderness in the right upper back in the musculature.  There is also a little bit of midline tenderness in the mid thoracic spine.  No step-offs or deformities.  No pain to the cervical or lumbosacral spine.  No swelling to her lower extremities, no calf tenderness.  Lymphadenopathy:     Cervical: No cervical adenopathy.  Skin:    General: Skin is warm and dry.     Findings: No rash.  Neurological:     Mental Status: She is alert and oriented to person, place, and time.     Comments: Motor 5 out of 5 all extremities, sensation grossly intact to light touch all extremities     ED Results / Procedures / Treatments   Labs (all labs ordered are listed, but only abnormal results are displayed) Labs Reviewed - No data to display  EKG None  Radiology DG Thoracic Spine 2 View Result Date: 09/09/2023 CLINICAL DATA:  Thoracic back pain for 4 days. No reported acute injury. EXAM: THORACIC SPINE 2 VIEWS COMPARISON:  Lumbar spine radiographs 11/20/2019. Chest and rib radiographs 10/29/2013. FINDINGS: There are 12 rib-bearing thoracic type vertebral bodies. The alignment is normal. The disc spaces are preserved. No evidence of acute fracture, paraspinal hematoma or widening of the interpedicular distance. IMPRESSION: No acute osseous findings or significant spondylosis. Electronically Signed   By: Carey Bullocks M.D.   On: 09/09/2023 16:24    Procedures Procedures    Medications Ordered in ED Medications - No data to display  ED Course/ Medical Decision Making/ A&P                                 Medical Decision  Making Amount and/or Complexity of Data Reviewed Radiology: ordered.  Risk Prescription drug management.   Patient is a 33 year old who presents with right upper back pain.  It seems to be musculoskeletal in nature.  X-rays were obtained which were interpreted by me and confirmed by the radiologist to show no evidence of bony injury.  No fractures identified.  She does not have fever or other red flag  symptoms.  No IV drug use.  No other clinical concerns for epidural abscess/bleeding.  She is not anticoagulants.  She does not have any radicular symptoms or neurologic deficits.  No symptoms that would be more concerning for PE.  She was discharged home in good condition.  But she was given prescriptions for Flexeril and Naprosyn.  She was given referral to follow-up with orthopedics if her symptoms are improving.  Return precautions were given.  Final Clinical Impression(s) / ED Diagnoses Final diagnoses:  Back strain, initial encounter    Rx / DC Orders ED Discharge Orders          Ordered    cyclobenzaprine (FLEXERIL) 10 MG tablet  2 times daily PRN        09/09/23 1632    naproxen (NAPROSYN) 375 MG tablet  2 times daily        09/09/23 1632              Rolan Bucco, MD 09/09/23 1650

## 2024-03-05 ENCOUNTER — Encounter: Payer: Self-pay | Admitting: Obstetrics & Gynecology

## 2024-04-14 ENCOUNTER — Ambulatory Visit: Payer: Self-pay | Admitting: *Deleted

## 2024-04-14 VITALS — BP 115/73 | Wt 155.0 lb

## 2024-04-14 DIAGNOSIS — R87612 Low grade squamous intraepithelial lesion on cytologic smear of cervix (LGSIL): Secondary | ICD-10-CM

## 2024-04-14 DIAGNOSIS — Z1239 Encounter for other screening for malignant neoplasm of breast: Secondary | ICD-10-CM

## 2024-04-14 NOTE — Patient Instructions (Signed)
 Explained breast self awareness with Lavanya Shehadeh. Patient did not need a Pap smear today due to last Pap smear was 02/25/2024. Explained the colposcopy the recommended follow up for her abnormal Pap smear. Referred patient to the [facility] for [facility] for a colposcopy to follow up for her abnormal Pap smear. Appointment scheduled Monday, April 27, 2024 at [phone]. Patient aware of appointment and will be there. Let patient know a screening mammogram is recommended at age 21 unless clinically indicated prior. Zeola Klutz verbalized understanding.  Emilia Kayes, Wanda Ship, RN 2:44 PM

## 2024-04-14 NOTE — Progress Notes (Signed)
 Ms. Melinda Barnes is a 33 y.o. female who presents to [facility] clinic today with no complaints. Patient referred to [facility] due to having an abnormal Pap smear 02/25/2024 that a colposcopy is recommended for follow up.   Pap Smear: Pap smear not completed today. Last Pap smear was 02/25/2024 at the [facility] clinic and was abnormal - LSIL with positive HPV. Per patient has no history of an abnormal Pap smear prior to her most recent Pap smear. Last Pap smear result is available in Epic.   Physical exam: Breasts Breasts symmetrical. No skin abnormalities bilateral breasts. No nipple retraction bilateral breasts. No nipple discharge bilateral breasts. No lymphadenopathy. No lumps palpated bilateral breasts. No complaints of pain or tenderness on exam. Screening mammogram recommended at age 50 unless clinically indicated prior.      Pelvic/Bimanual Pap is not indicated today per [facility] guidelines.   Smoking History: Patient is a former smoker that smoked for a year when she was 14 and then began vaping. Discussed smoking cessation with patient. Referred to the [facility] Quitline.   Patient Navigation: Patient education provided. Access to services provided for patient through [facility] program.    Breast and Cervical Cancer Risk Assessment: Patient has family history of her maternal grandmother having breast cancer. Patient has no known genetic mutations or history of radiation treatment to the chest before age 57. Patient does not have history of cervical dysplasia, immunocompromised, or DES exposure in-utero. Breast cancer risk assessment completed. No breast cancer risk calculated due to patient is less than 109 years old.  Risk Assessment   No risk assessment data     A: [facility] exam without pap smear No complaints.  P: Referred patient to the [facility] for [facility] for a colposcopy to follow up for her abnormal Pap smear. Appointment scheduled Monday, April 27, 2024 at [phone].  Driscilla Wanda SQUIBB, RN 04/14/2024 2:47 PM

## 2024-04-27 ENCOUNTER — Encounter: Payer: Self-pay | Admitting: Family Medicine

## 2024-04-27 ENCOUNTER — Other Ambulatory Visit (HOSPITAL_COMMUNITY)
Admission: RE | Admit: 2024-04-27 | Discharge: 2024-04-27 | Disposition: A | Discharge status: Home / Self Care | Payer: Self-pay | Source: Ambulatory Visit | Attending: Family Medicine | Admitting: Family Medicine

## 2024-04-27 ENCOUNTER — Ambulatory Visit: Payer: Self-pay | Admitting: Family Medicine

## 2024-04-27 VITALS — BP 107/67 | HR 61 | Ht 63.0 in | Wt 151.0 lb

## 2024-04-27 DIAGNOSIS — Z3202 Encounter for pregnancy test, result negative: Secondary | ICD-10-CM

## 2024-04-27 DIAGNOSIS — R87612 Low grade squamous intraepithelial lesion on cytologic smear of cervix (LGSIL): Secondary | ICD-10-CM

## 2024-04-27 DIAGNOSIS — Z720 Tobacco use: Secondary | ICD-10-CM | POA: Insufficient documentation

## 2024-04-27 DIAGNOSIS — Z23 Encounter for immunization: Secondary | ICD-10-CM

## 2024-04-27 NOTE — Addendum Note (Signed)
 Addended by: FREDIRICK GLENYS RAMAN on: 04/27/2024 10:07 AM   Modules accepted: Orders

## 2024-04-27 NOTE — Assessment & Plan Note (Signed)
 S/p colpo today with treatment as indicated Consider HPV vaccination if she hasn't had it.

## 2024-04-27 NOTE — Assessment & Plan Note (Signed)
 Discussed impact on HPV clearance. She is not currently interested in cessation

## 2024-04-27 NOTE — Progress Notes (Addendum)
    GYNECOLOGY OFFICE COLPOSCOPY PROCEDURE NOTE  33 y.o. [id number] here for colposcopy for low-grade squamous intraepithelial neoplasia (LGSIL - encompassing HPV,mild dysplasia,CIN I) pap smear on 02/25/2024. Discussed role for HPV in cervical dysplasia, need for surveillance.  Chaperone present for exam  Patient gave informed written consent, time out was performed.  Urine pregnancy test negative. Patient was placed in lithotomy position. Cervix viewed with speculum and colposcope after application of acetic acid.   Colposcopy adequate? Yes  acetowhite lesion(s) noted at anterior SCJ; 4 4 quadrant biopsies obtained.  ECC specimen obtained. All specimens were labeled and sent to pathology.  Chaperone was present during entire procedure.  Patient was given post procedure instructions.   Problem List Items Addressed This Visit       Unprioritized   Tobacco use   Discussed impact on HPV clearance. She is not currently interested in cessation      LGSIL on Pap smear of cervix   S/p colpo today with treatment as indicated Consider HPV vaccination if she hasn't had it.      Relevant Orders   Surgical pathology( [place]/ [facility])   Other Visit Diagnoses       Flu vaccine need    -  Primary   Relevant Orders   Flu vaccine trivalent PF, 6mos and older(Flulaval,Afluria,Fluarix,Fluzone) (Completed)      Return in about 1 year (around 04/27/2025) for repeat pap.    Melinda Barnes 04/27/2024 10:02 AM

## 2024-04-28 ENCOUNTER — Ambulatory Visit: Payer: Self-pay | Admitting: Family Medicine

## 2024-04-28 LAB — SURGICAL PATHOLOGY

## 2024-04-28 LAB — POCT PREGNANCY, URINE: Preg Test, Ur: NEGATIVE

## 2024-04-30 ENCOUNTER — Telehealth: Payer: Self-pay | Admitting: *Deleted

## 2024-04-30 NOTE — Telephone Encounter (Signed)
 Pt left message stating that she cannot get into Mychart to see her test results. She further stated that she has been called to come back and wants additional information.

## 2024-05-04 NOTE — Telephone Encounter (Signed)
 RN returned pt call.  Pt states she has not been able to login in her MyChart and would like to know the results from her colposcopy, she has not been able to read the result message from Dr. Fredirick.   Advise pt of HGSIL result and Dr. Alonzo recommendation of LEEP procedure visit to remove the abnormal cervical cells.  Pt verbalized understanding and had no further questions at this time.    [place], RN

## 2024-05-20 ENCOUNTER — Telehealth: Payer: Self-pay

## 2024-05-20 NOTE — Telephone Encounter (Signed)
 Medicaid application completed, sent to patient for signature.

## 2024-06-26 ENCOUNTER — Ambulatory Visit: Payer: Self-pay | Admitting: Obstetrics and Gynecology

## 2024-07-07 ENCOUNTER — Telehealth: Payer: Self-pay | Admitting: Family Medicine

## 2024-07-07 NOTE — Telephone Encounter (Signed)
 Attempted to call pt to reschedule LEEP appointment. Was unable to contact pt or leave a VM. Sent mychart message to pt letting her know that she needs to reschedule her procedure appt.
